# Patient Record
Sex: Female | Born: 1940 | Race: White | Hispanic: No | Smoking: Never smoker
Health system: Southern US, Community
[De-identification: ages and names within clinical notes are randomized; demographics above are authoritative.]

---

## 2003-12-05 ENCOUNTER — Ambulatory Visit (HOSPITAL_COMMUNITY): Admission: RE | Admit: 2003-12-05 | Discharge: 2003-12-05 | Payer: Self-pay | Admitting: *Deleted

## 2003-12-05 ENCOUNTER — Encounter (INDEPENDENT_AMBULATORY_CARE_PROVIDER_SITE_OTHER): Payer: Self-pay | Admitting: Specialist

## 2004-08-06 ENCOUNTER — Observation Stay (HOSPITAL_COMMUNITY): Admission: AD | Admit: 2004-08-06 | Discharge: 2004-08-07 | Payer: Self-pay | Admitting: Orthopedic Surgery

## 2004-08-06 ENCOUNTER — Encounter (INDEPENDENT_AMBULATORY_CARE_PROVIDER_SITE_OTHER): Payer: Self-pay | Admitting: *Deleted

## 2004-08-06 ENCOUNTER — Ambulatory Visit (HOSPITAL_BASED_OUTPATIENT_CLINIC_OR_DEPARTMENT_OTHER): Admission: RE | Admit: 2004-08-06 | Discharge: 2004-08-06 | Payer: Self-pay | Admitting: Orthopedic Surgery

## 2004-08-06 ENCOUNTER — Ambulatory Visit (HOSPITAL_COMMUNITY): Admission: RE | Admit: 2004-08-06 | Discharge: 2004-08-06 | Payer: Self-pay | Admitting: Orthopedic Surgery

## 2008-05-06 ENCOUNTER — Encounter: Admission: RE | Admit: 2008-05-06 | Discharge: 2008-05-06 | Payer: Self-pay | Admitting: Internal Medicine

## 2009-05-27 ENCOUNTER — Encounter: Admission: RE | Admit: 2009-05-27 | Discharge: 2009-05-27 | Payer: Self-pay | Admitting: Internal Medicine

## 2010-06-03 ENCOUNTER — Other Ambulatory Visit: Payer: Self-pay | Admitting: Internal Medicine

## 2010-06-03 DIAGNOSIS — Z1231 Encounter for screening mammogram for malignant neoplasm of breast: Secondary | ICD-10-CM

## 2010-06-17 ENCOUNTER — Ambulatory Visit
Admission: RE | Admit: 2010-06-17 | Discharge: 2010-06-17 | Disposition: A | Discharge status: Home / Self Care | Payer: Medicare Other | Source: Ambulatory Visit | Attending: Internal Medicine | Admitting: Internal Medicine

## 2010-06-17 ENCOUNTER — Ambulatory Visit: Payer: Self-pay

## 2010-06-17 DIAGNOSIS — Z1231 Encounter for screening mammogram for malignant neoplasm of breast: Secondary | ICD-10-CM

## 2010-07-10 NOTE — Op Note (Signed)
[name], [name]               ACCOUNT NO.:  [id number]   MEDICAL RECORD NO.:  [id number]          PATIENT TYPE:  AMB   LOCATION:  [facility]                         FACILITY:  [facility]   PHYSICIAN:  Marlowe Kays, M.D.  DATE OF BIRTH:  1940-09-11   DATE OF PROCEDURE:  08/06/2004  DATE OF DISCHARGE:                                 OPERATIVE REPORT   PREOPERATIVE DIAGNOSES:  1.  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  2.  Right third claw toe.   POSTOPERATIVE DIAGNOSES:  1.  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  2.  Right third claw toe.   OPERATION:  1.  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  2.  Correction right third claw toe with fusion of PIP joint and dorsal      capsulotomy MP joint.   SURGEON:  Marlowe Kays, M.D.   ASSISTANT:  Nurse.   ANESTHESIA:  General.   PATHOLOGY AND JUSTIFICATION FOR PROCEDURE:  She has had her problem for some  time and has failed conservative treatment.   DESCRIPTION OF PROCEDURE:  Satisfactory general anesthesia, bilateral  pneumatic tourniquet, legs were esmarched our nonsterilely and feet and  ankles prepped with DuraPrep, draped in a sterile field. In the left foot, I  made a dorsal web splitting incision between the second and third toes.  Mainly with blunt dissection, I dissected out a large congealed mass of  common digital nerve which I grasped with an Allis clamp and dissected it  out both with scissors and with cutting cautery removing a large mass of  tissue down in between the second and third metatarsal heads. This was sent  to pathology. I then checked to be sure that all nerve __________ material  had been removed and packed the wound with gauze and released the  tourniquet. I then went to the right foot where I performed the same  surgical procedure for the Morton's neuroma there and after this had been  excised, I made a dorsal incision bias to the outer side  of the third toe to  try and give as much skin between the two incisions as possible. I split the  extensor tendon on the lateral side and exposed the PIP joint. I then  removed the end portions of the proximal phalanx distally and middle phalanx  proximally so that we could get a good squared off cut. I then used a 0.45  smooth K wire retrograde to stabilize the toe. I stabilized the PIP joint. I  also undermined the Morton's neuroma excision to look at the MP joint of the  third toe and under direct visualization with some small scissors performed  a dorsal capsulotomy protecting the extensor tendon to help bring the  proximal phalanx down in line with the third metatarsal head. Under direct  visualization, I was then able to see the K wire go into the metatarsal head  with the proximal phalanx and the metatarsal head lining up in the same  plane. I then released the tourniquet on the right foot and went  back and  coagulated some minimal bleeders in the left foot closing the deep tissues  with interrupted 3-0 Vicryl and the skin with interrupted 4-0 nylon mattress  sutures. This wound was infiltrated with 0.5% plain Marcaine. I then  returned to the right foot and checked for minimal bleeders and closed the  Morton's neuroma incision in the same fashion. The third toe incision I  closed with running 4-0 Vicryl in the extensor tendon and 4-0 nylon in the  skin. These toes were also blocked with 0.5% plain Marcaine. The pin was  bent and cut with a pin protector placed. Betadine adaptic dry sterile  dressing to both feet. She tolerated the procedure well and at the time of  this dictation was on her way to the recovery room in satisfactory condition  with no known complications.       JA/MEDQ  D:  08/06/2004  T:  08/06/2004  Job:  [id number]

## 2010-07-23 ENCOUNTER — Encounter: Payer: Self-pay | Admitting: Podiatry

## 2011-03-29 DIAGNOSIS — D3130 Benign neoplasm of unspecified choroid: Secondary | ICD-10-CM | POA: Diagnosis not present

## 2011-05-11 ENCOUNTER — Other Ambulatory Visit: Payer: Self-pay | Admitting: Internal Medicine

## 2011-05-11 DIAGNOSIS — Z1231 Encounter for screening mammogram for malignant neoplasm of breast: Secondary | ICD-10-CM

## 2011-06-14 DIAGNOSIS — E559 Vitamin D deficiency, unspecified: Secondary | ICD-10-CM | POA: Diagnosis not present

## 2011-06-14 DIAGNOSIS — M949 Disorder of cartilage, unspecified: Secondary | ICD-10-CM | POA: Diagnosis not present

## 2011-06-14 DIAGNOSIS — M899 Disorder of bone, unspecified: Secondary | ICD-10-CM | POA: Diagnosis not present

## 2011-06-14 DIAGNOSIS — Z0000 Encounter for general adult medical examination without abnormal findings: Secondary | ICD-10-CM | POA: Diagnosis not present

## 2011-06-14 DIAGNOSIS — L84 Corns and callosities: Secondary | ICD-10-CM | POA: Diagnosis not present

## 2011-06-14 DIAGNOSIS — E785 Hyperlipidemia, unspecified: Secondary | ICD-10-CM | POA: Diagnosis not present

## 2011-06-18 ENCOUNTER — Ambulatory Visit
Admission: RE | Admit: 2011-06-18 | Discharge: 2011-06-18 | Disposition: A | Discharge status: Home / Self Care | Payer: PRIVATE HEALTH INSURANCE | Source: Ambulatory Visit | Attending: Internal Medicine | Admitting: Internal Medicine

## 2011-06-18 DIAGNOSIS — Z1231 Encounter for screening mammogram for malignant neoplasm of breast: Secondary | ICD-10-CM

## 2011-06-21 DIAGNOSIS — F341 Dysthymic disorder: Secondary | ICD-10-CM | POA: Diagnosis not present

## 2011-06-21 DIAGNOSIS — M899 Disorder of bone, unspecified: Secondary | ICD-10-CM | POA: Diagnosis not present

## 2011-06-21 DIAGNOSIS — E785 Hyperlipidemia, unspecified: Secondary | ICD-10-CM | POA: Diagnosis not present

## 2011-06-21 DIAGNOSIS — M949 Disorder of cartilage, unspecified: Secondary | ICD-10-CM | POA: Diagnosis not present

## 2011-06-21 DIAGNOSIS — Z0000 Encounter for general adult medical examination without abnormal findings: Secondary | ICD-10-CM | POA: Diagnosis not present

## 2011-06-24 DIAGNOSIS — F39 Unspecified mood [affective] disorder: Secondary | ICD-10-CM | POA: Diagnosis not present

## 2011-06-29 DIAGNOSIS — Z1212 Encounter for screening for malignant neoplasm of rectum: Secondary | ICD-10-CM | POA: Diagnosis not present

## 2011-07-05 DIAGNOSIS — F39 Unspecified mood [affective] disorder: Secondary | ICD-10-CM | POA: Diagnosis not present

## 2011-07-20 DIAGNOSIS — F39 Unspecified mood [affective] disorder: Secondary | ICD-10-CM | POA: Diagnosis not present

## 2011-08-17 DIAGNOSIS — F39 Unspecified mood [affective] disorder: Secondary | ICD-10-CM | POA: Diagnosis not present

## 2011-09-28 DIAGNOSIS — F39 Unspecified mood [affective] disorder: Secondary | ICD-10-CM | POA: Diagnosis not present

## 2011-10-01 DIAGNOSIS — R110 Nausea: Secondary | ICD-10-CM | POA: Diagnosis not present

## 2011-10-01 DIAGNOSIS — R42 Dizziness and giddiness: Secondary | ICD-10-CM | POA: Diagnosis not present

## 2011-10-01 DIAGNOSIS — I498 Other specified cardiac arrhythmias: Secondary | ICD-10-CM | POA: Diagnosis not present

## 2011-10-01 DIAGNOSIS — R51 Headache: Secondary | ICD-10-CM | POA: Diagnosis not present

## 2011-10-22 DIAGNOSIS — J309 Allergic rhinitis, unspecified: Secondary | ICD-10-CM | POA: Diagnosis not present

## 2011-10-22 DIAGNOSIS — E559 Vitamin D deficiency, unspecified: Secondary | ICD-10-CM | POA: Diagnosis not present

## 2011-10-22 DIAGNOSIS — R5383 Other fatigue: Secondary | ICD-10-CM | POA: Diagnosis not present

## 2011-10-22 DIAGNOSIS — R42 Dizziness and giddiness: Secondary | ICD-10-CM | POA: Diagnosis not present

## 2011-10-22 DIAGNOSIS — R5381 Other malaise: Secondary | ICD-10-CM | POA: Diagnosis not present

## 2011-11-01 DIAGNOSIS — L84 Corns and callosities: Secondary | ICD-10-CM | POA: Diagnosis not present

## 2011-11-04 DIAGNOSIS — R5381 Other malaise: Secondary | ICD-10-CM | POA: Diagnosis not present

## 2011-11-04 DIAGNOSIS — J309 Allergic rhinitis, unspecified: Secondary | ICD-10-CM | POA: Diagnosis not present

## 2011-11-26 DIAGNOSIS — R5383 Other fatigue: Secondary | ICD-10-CM | POA: Diagnosis not present

## 2011-11-26 DIAGNOSIS — R5381 Other malaise: Secondary | ICD-10-CM | POA: Diagnosis not present

## 2011-12-02 ENCOUNTER — Ambulatory Visit: Payer: Medicare Other | Admitting: Licensed Clinical Social Worker

## 2011-12-17 DIAGNOSIS — R404 Transient alteration of awareness: Secondary | ICD-10-CM | POA: Diagnosis not present

## 2011-12-17 DIAGNOSIS — R5383 Other fatigue: Secondary | ICD-10-CM | POA: Diagnosis not present

## 2011-12-17 DIAGNOSIS — F341 Dysthymic disorder: Secondary | ICD-10-CM | POA: Diagnosis not present

## 2011-12-17 DIAGNOSIS — R5381 Other malaise: Secondary | ICD-10-CM | POA: Diagnosis not present

## 2012-04-10 DIAGNOSIS — D3130 Benign neoplasm of unspecified choroid: Secondary | ICD-10-CM | POA: Diagnosis not present

## 2012-05-18 ENCOUNTER — Other Ambulatory Visit: Payer: Self-pay

## 2012-05-18 DIAGNOSIS — Z1231 Encounter for screening mammogram for malignant neoplasm of breast: Secondary | ICD-10-CM

## 2012-06-30 ENCOUNTER — Ambulatory Visit
Admission: RE | Admit: 2012-06-30 | Discharge: 2012-06-30 | Disposition: A | Discharge status: Home / Self Care | Payer: Medicare Other | Source: Ambulatory Visit

## 2012-06-30 DIAGNOSIS — Z1231 Encounter for screening mammogram for malignant neoplasm of breast: Secondary | ICD-10-CM | POA: Diagnosis not present

## 2012-06-30 DIAGNOSIS — E559 Vitamin D deficiency, unspecified: Secondary | ICD-10-CM | POA: Diagnosis not present

## 2012-06-30 DIAGNOSIS — E785 Hyperlipidemia, unspecified: Secondary | ICD-10-CM | POA: Diagnosis not present

## 2012-07-04 DIAGNOSIS — Z1212 Encounter for screening for malignant neoplasm of rectum: Secondary | ICD-10-CM | POA: Diagnosis not present

## 2012-07-07 DIAGNOSIS — M949 Disorder of cartilage, unspecified: Secondary | ICD-10-CM | POA: Diagnosis not present

## 2012-07-07 DIAGNOSIS — M899 Disorder of bone, unspecified: Secondary | ICD-10-CM | POA: Diagnosis not present

## 2012-07-07 DIAGNOSIS — E559 Vitamin D deficiency, unspecified: Secondary | ICD-10-CM | POA: Diagnosis not present

## 2012-07-07 DIAGNOSIS — Z0000 Encounter for general adult medical examination without abnormal findings: Secondary | ICD-10-CM | POA: Diagnosis not present

## 2012-07-07 DIAGNOSIS — R5381 Other malaise: Secondary | ICD-10-CM | POA: Diagnosis not present

## 2012-07-07 DIAGNOSIS — IMO0002 Reserved for concepts with insufficient information to code with codable children: Secondary | ICD-10-CM | POA: Diagnosis not present

## 2012-07-07 DIAGNOSIS — R5383 Other fatigue: Secondary | ICD-10-CM | POA: Diagnosis not present

## 2012-07-07 DIAGNOSIS — Z86010 Personal history of colonic polyps: Secondary | ICD-10-CM | POA: Diagnosis not present

## 2012-07-07 DIAGNOSIS — J309 Allergic rhinitis, unspecified: Secondary | ICD-10-CM | POA: Diagnosis not present

## 2012-07-07 DIAGNOSIS — E785 Hyperlipidemia, unspecified: Secondary | ICD-10-CM | POA: Diagnosis not present

## 2012-07-20 DIAGNOSIS — Q828 Other specified congenital malformations of skin: Secondary | ICD-10-CM | POA: Diagnosis not present

## 2012-07-20 DIAGNOSIS — L84 Corns and callosities: Secondary | ICD-10-CM | POA: Diagnosis not present

## 2012-11-20 DIAGNOSIS — L84 Corns and callosities: Secondary | ICD-10-CM | POA: Diagnosis not present

## 2013-02-12 ENCOUNTER — Encounter: Payer: Self-pay | Admitting: Podiatry

## 2013-02-12 ENCOUNTER — Ambulatory Visit: Payer: Medicare Other | Admitting: Podiatry

## 2013-02-12 VITALS — BP 121/61 | HR 67 | Resp 18

## 2013-02-12 DIAGNOSIS — L84 Corns and callosities: Secondary | ICD-10-CM

## 2013-02-12 NOTE — Progress Notes (Signed)
Subjective:     Patient ID: [name], female   DOB: 24-Feb-1940, 72 y.o.   MRN: [id number]  HPI patient presents with thick lesion sub-metatarsal both feet and orthotics which provide some benefit for her   Review of Systems     Objective:   Physical Exam Neurovascular status intact with significant structural forefoot derangement with lesion formation    Assessment:     Poor foot mechanics with lesions that are painful on both feet    Plan:     Debridement of painful lesions both feet with no bleeding noted

## 2013-02-12 NOTE — Progress Notes (Signed)
° °  Subjective:    Patient ID: [name], female    DOB: 1940-07-15, 72 y.o.   MRN: [id number]  HPI trim my calluses on the ball of both my feet    Review of Systems  Constitutional: Negative.   HENT: Negative.   Eyes: Negative.   Respiratory: Negative.   Cardiovascular: Negative.   Gastrointestinal: Negative.   Endocrine: Negative.   Genitourinary: Negative.   Musculoskeletal: Negative.   Skin: Negative.   Allergic/Immunologic: Positive for environmental allergies.  Neurological: Negative.   Hematological: Bruises/bleeds easily.  Psychiatric/Behavioral: Negative.        Objective:   Physical Exam        Assessment & Plan:

## 2013-04-20 DIAGNOSIS — D3130 Benign neoplasm of unspecified choroid: Secondary | ICD-10-CM | POA: Diagnosis not present

## 2013-04-26 ENCOUNTER — Ambulatory Visit: Payer: PRIVATE HEALTH INSURANCE | Admitting: Podiatry

## 2013-05-07 ENCOUNTER — Ambulatory Visit: Payer: Medicare Other | Admitting: Podiatry

## 2013-05-07 ENCOUNTER — Encounter: Payer: Self-pay | Admitting: Podiatry

## 2013-05-07 VITALS — BP 126/63 | HR 67 | Resp 15 | Ht 66.5 in | Wt 160.0 lb

## 2013-05-07 DIAGNOSIS — L84 Corns and callosities: Secondary | ICD-10-CM | POA: Diagnosis not present

## 2013-05-07 NOTE — Progress Notes (Signed)
   Subjective:    Patient ID: [name], female    DOB: 07-01-1940, 73 y.o.   MRN: [id number] Pt presents for debridement of B/L 2-3MPJ  Plantar callouses and padding of orthotic. HPI    Review of Systems     Objective:   Physical Exam        Assessment & Plan:

## 2013-05-09 NOTE — Progress Notes (Signed)
Subjective:     Patient ID: [name], female   DOB: 04-10-1940, 73 y.o.   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

## 2013-06-06 ENCOUNTER — Encounter: Payer: Self-pay | Admitting: Podiatry

## 2013-06-12 ENCOUNTER — Other Ambulatory Visit: Payer: Self-pay

## 2013-06-12 DIAGNOSIS — Z1231 Encounter for screening mammogram for malignant neoplasm of breast: Secondary | ICD-10-CM

## 2013-07-04 ENCOUNTER — Ambulatory Visit
Admission: RE | Admit: 2013-07-04 | Discharge: 2013-07-04 | Disposition: A | Discharge status: Home / Self Care | Payer: Medicare Other | Source: Ambulatory Visit

## 2013-07-04 ENCOUNTER — Encounter (INDEPENDENT_AMBULATORY_CARE_PROVIDER_SITE_OTHER): Payer: Self-pay

## 2013-07-04 DIAGNOSIS — Z1231 Encounter for screening mammogram for malignant neoplasm of breast: Secondary | ICD-10-CM | POA: Diagnosis not present

## 2013-07-19 DIAGNOSIS — L259 Unspecified contact dermatitis, unspecified cause: Secondary | ICD-10-CM | POA: Diagnosis not present

## 2013-07-24 DIAGNOSIS — M899 Disorder of bone, unspecified: Secondary | ICD-10-CM | POA: Diagnosis not present

## 2013-07-24 DIAGNOSIS — M949 Disorder of cartilage, unspecified: Secondary | ICD-10-CM | POA: Diagnosis not present

## 2013-08-13 ENCOUNTER — Ambulatory Visit: Payer: Medicare Other | Admitting: Podiatry

## 2013-08-23 ENCOUNTER — Ambulatory Visit: Payer: Medicare Other | Admitting: Podiatry

## 2013-08-23 ENCOUNTER — Encounter: Payer: Self-pay | Admitting: Podiatry

## 2013-08-23 DIAGNOSIS — L84 Corns and callosities: Secondary | ICD-10-CM | POA: Diagnosis not present

## 2013-08-25 NOTE — Progress Notes (Signed)
Subjective:     Patient ID: [name], female   DOB: [birth year], 73 y.o.   MRN: [id number]  HPI patient presents with lesions that are painful on both feet   Review of Systems     Objective:   Physical Exam Neurovascular unchanged with keratotic lesions plantar aspect bilateral    Assessment:     Callus formation bilateral    Plan:     Debris painful calluses bilateral with no bleeding noted

## 2013-12-03 ENCOUNTER — Ambulatory Visit: Payer: PRIVATE HEALTH INSURANCE | Admitting: Podiatry

## 2013-12-10 ENCOUNTER — Ambulatory Visit (INDEPENDENT_AMBULATORY_CARE_PROVIDER_SITE_OTHER): Payer: Medicare Other | Admitting: Podiatry

## 2013-12-10 DIAGNOSIS — L84 Corns and callosities: Secondary | ICD-10-CM | POA: Diagnosis not present

## 2013-12-10 DIAGNOSIS — M79673 Pain in unspecified foot: Secondary | ICD-10-CM

## 2013-12-10 NOTE — Progress Notes (Signed)
   Subjective:    Patient ID: [name], female    DOB: 1941/01/16, 73 y.o.   MRN: [id number]  HPI  Review of Systems     Objective:   Physical Exam        Assessment & Plan:

## 2013-12-11 NOTE — Progress Notes (Signed)
Subjective:     Patient ID: [name], female   DOB: 1941/01/30, 73 y.o.   MRN: [id number]  HPI patient presents with calluses on the bottom of both feet   Review of Systems     Objective:   Physical Exam Neurovascular status intact with thick calluses plantar aspect of the    Assessment:     Chronic lesion formation    Plan:     Debridement lesions on both feet no bleeding noted

## 2014-02-18 ENCOUNTER — Encounter: Payer: Self-pay | Admitting: Podiatry

## 2014-02-18 ENCOUNTER — Ambulatory Visit (INDEPENDENT_AMBULATORY_CARE_PROVIDER_SITE_OTHER): Payer: Medicare Other | Admitting: Podiatry

## 2014-02-18 DIAGNOSIS — M779 Enthesopathy, unspecified: Secondary | ICD-10-CM

## 2014-02-18 DIAGNOSIS — L84 Corns and callosities: Secondary | ICD-10-CM | POA: Diagnosis not present

## 2014-02-18 MED ORDER — TRIAMCINOLONE ACETONIDE 10 MG/ML IJ SUSP
10.0000 mg | Freq: Once | INTRAMUSCULAR | Status: AC
  Administered 2014-02-18: 10 mg

## 2014-02-18 NOTE — Progress Notes (Signed)
Subjective:     Patient ID: [name], female   DOB: 24-Sep-1940, 73 y.o.   MRN: [id number]  HPI patient presents stating I developed pain on the plantar aspect of my right foot and I have these calluses that I always have and I will probably need new orthotics   Review of Systems     Objective:   Physical Exam Neurovascular status unchanged with muscle strength adequate and range of motion subtalar midtarsal joint within normal limits. Patient is found to have discomfort with inflammation and fluid at the base of the fifth metatarsal right plantar surface with lesion formation and has keratotic lesion sub-third metatarsal of both feet    Assessment:     Inflammatory capsulitis base of fifth metatarsal right with probable porokeratotic type lesion and callus formation plantar of both feet    Plan:     Injected the plantar capsule right 3 mg Kenalog 5 mg Xylocaine and then did deep debridement of lesion and debridement of lesions on the third metatarsal of both feet. Instructed on new orthotics and scanned for customized orthotic devices to help reduce pressure

## 2014-03-18 ENCOUNTER — Ambulatory Visit: Payer: PRIVATE HEALTH INSURANCE | Admitting: Podiatry

## 2014-03-19 ENCOUNTER — Ambulatory Visit: Payer: Medicare Other | Admitting: *Deleted

## 2014-03-19 DIAGNOSIS — M779 Enthesopathy, unspecified: Secondary | ICD-10-CM

## 2014-03-19 NOTE — Progress Notes (Signed)
PICKING UP ORTHOTICS

## 2014-03-19 NOTE — Patient Instructions (Signed)

## 2014-03-21 ENCOUNTER — Ambulatory Visit: Payer: PRIVATE HEALTH INSURANCE | Admitting: Podiatry

## 2014-04-15 DIAGNOSIS — H40219 Acute angle-closure glaucoma, unspecified eye: Secondary | ICD-10-CM | POA: Diagnosis not present

## 2014-04-15 DIAGNOSIS — D3131 Benign neoplasm of right choroid: Secondary | ICD-10-CM | POA: Diagnosis not present

## 2014-05-03 ENCOUNTER — Other Ambulatory Visit: Payer: Self-pay | Admitting: Gastroenterology

## 2014-05-03 DIAGNOSIS — K640 First degree hemorrhoids: Secondary | ICD-10-CM | POA: Diagnosis not present

## 2014-05-03 DIAGNOSIS — K644 Residual hemorrhoidal skin tags: Secondary | ICD-10-CM | POA: Diagnosis not present

## 2014-05-03 DIAGNOSIS — Z86010 Personal history of colonic polyps: Secondary | ICD-10-CM | POA: Diagnosis not present

## 2014-05-03 DIAGNOSIS — D126 Benign neoplasm of colon, unspecified: Secondary | ICD-10-CM | POA: Diagnosis not present

## 2014-05-03 DIAGNOSIS — K5730 Diverticulosis of large intestine without perforation or abscess without bleeding: Secondary | ICD-10-CM | POA: Diagnosis not present

## 2014-05-03 DIAGNOSIS — D125 Benign neoplasm of sigmoid colon: Secondary | ICD-10-CM | POA: Diagnosis not present

## 2014-05-03 DIAGNOSIS — Z09 Encounter for follow-up examination after completed treatment for conditions other than malignant neoplasm: Secondary | ICD-10-CM | POA: Diagnosis not present

## 2014-05-13 ENCOUNTER — Encounter: Payer: Self-pay | Admitting: Podiatry

## 2014-05-13 ENCOUNTER — Ambulatory Visit (INDEPENDENT_AMBULATORY_CARE_PROVIDER_SITE_OTHER): Payer: Medicare Other | Admitting: Podiatry

## 2014-05-13 VITALS — BP 108/59 | HR 65 | Resp 15

## 2014-05-13 DIAGNOSIS — B079 Viral wart, unspecified: Secondary | ICD-10-CM | POA: Diagnosis not present

## 2014-05-13 DIAGNOSIS — B078 Other viral warts: Secondary | ICD-10-CM

## 2014-05-13 DIAGNOSIS — M779 Enthesopathy, unspecified: Secondary | ICD-10-CM | POA: Diagnosis not present

## 2014-05-13 DIAGNOSIS — B070 Plantar wart: Secondary | ICD-10-CM | POA: Diagnosis not present

## 2014-05-13 DIAGNOSIS — L84 Corns and callosities: Secondary | ICD-10-CM | POA: Diagnosis not present

## 2014-05-13 MED ORDER — TRIAMCINOLONE ACETONIDE 10 MG/ML IJ SUSP
10.0000 mg | Freq: Once | INTRAMUSCULAR | Status: AC
  Administered 2014-05-13: 10 mg

## 2014-05-13 NOTE — Progress Notes (Signed)
Subjective:     Patient ID: [name], female   DOB: 04-Feb-1941, 74 y.o.   MRN: [id number]  HPI patient presents with inflamed base of the right fifth metatarsal lesion on the lateral side of the foot and large keratotic lesions on the forefoot of both feet that are painful when pressed   Review of Systems     Objective:   Physical Exam Neurovascular status intact muscle strength adequate with inflammatory plantar capsulitis fifth met base right with keratotic lesion noted that upon debridement shows pinpoint bleeding on the lateral side of the right foot and painful keratotic lesions on the second metatarsal of both feet    Assessment:     Capsulitis sub-fifth metatarsal right with verruca plantaris plantar aspect right and callus formation bilateral    Plan:     Debride lesion and for the right I did go ahead today and I applied a strong medication to create an immune response to the right probable verruca lesion and applied sterile dressing I then injected the base of the fifth metatarsal 3 mg Kenalog 5 mg Xylocaine and debrided remaining nails. Reappoint to recheck

## 2014-05-30 ENCOUNTER — Other Ambulatory Visit: Payer: Self-pay

## 2014-05-30 DIAGNOSIS — Z1231 Encounter for screening mammogram for malignant neoplasm of breast: Secondary | ICD-10-CM

## 2014-06-10 ENCOUNTER — Ambulatory Visit (INDEPENDENT_AMBULATORY_CARE_PROVIDER_SITE_OTHER): Payer: Medicare Other | Admitting: Podiatry

## 2014-06-10 ENCOUNTER — Encounter: Payer: Self-pay | Admitting: Podiatry

## 2014-06-10 VITALS — BP 119/57 | HR 63 | Resp 11

## 2014-06-10 DIAGNOSIS — B079 Viral wart, unspecified: Secondary | ICD-10-CM | POA: Diagnosis not present

## 2014-06-10 DIAGNOSIS — B078 Other viral warts: Secondary | ICD-10-CM

## 2014-06-10 DIAGNOSIS — L84 Corns and callosities: Secondary | ICD-10-CM

## 2014-06-10 NOTE — Progress Notes (Signed)
Subjective:     Patient ID: [name], female   DOB: Jan 20, 1941, 74 y.o.   MRN: [id number]  HPI patient presents with lesions underneath the second metatarsals of both feet and keratotic lesion sub-fifth metatarsal base right that was diagnosed as wart   Review of Systems     Objective:   Physical Exam Neurovascular status intact with keratotic lesion subsecond metatarsals bilateral that are painful and lesion plantar right that upon debridement shows pinpoint bleeding and pain to lateral pressure    Assessment:     Verruca plantaris plantar right along with callus formation    Plan:     Discussed the verruca plantaris condition and treatment options and at this point we just debrided padded and we'll see how this does. May require more aggressive treatment and today I debrided the lesions on both feet

## 2014-07-12 ENCOUNTER — Ambulatory Visit: Payer: Medicare Other

## 2014-07-12 ENCOUNTER — Ambulatory Visit
Admission: RE | Admit: 2014-07-12 | Discharge: 2014-07-12 | Disposition: A | Discharge status: Home / Self Care | Payer: Medicare Other | Source: Ambulatory Visit

## 2014-07-12 DIAGNOSIS — Z1231 Encounter for screening mammogram for malignant neoplasm of breast: Secondary | ICD-10-CM | POA: Diagnosis not present

## 2014-08-19 ENCOUNTER — Ambulatory Visit (INDEPENDENT_AMBULATORY_CARE_PROVIDER_SITE_OTHER): Payer: Medicare Other | Admitting: Podiatry

## 2014-08-19 ENCOUNTER — Encounter: Payer: Self-pay | Admitting: Podiatry

## 2014-08-19 DIAGNOSIS — L84 Corns and callosities: Secondary | ICD-10-CM | POA: Diagnosis not present

## 2014-08-19 NOTE — Progress Notes (Signed)
Subjective:     Patient ID: [name], female   DOB: Jun 29, 1940, 74 y.o.   MRN: [id number]  HPI patient presents with lesions plantar aspect both second metatarsal that are painful and she cannot cut   Review of Systems     Objective:   Physical Exam Neurovascular status intact with thick keratotic lesion subsecond metatarsal bilateral    Assessment:     Lesion secondary to pressure    Plan:     Debris painful lesion second metatarsal both feet with no iatrogenic bleeding noted

## 2014-08-19 NOTE — Progress Notes (Signed)
   Subjective:    Patient ID: [name], female    DOB: 08-10-1940, 74 y.o.   MRN: [id number]  HPI "They're about the same."    Review of Systems     Objective:   Physical Exam        Assessment & Plan:

## 2014-08-29 DIAGNOSIS — Z0000 Encounter for general adult medical examination without abnormal findings: Secondary | ICD-10-CM | POA: Diagnosis not present

## 2014-08-29 DIAGNOSIS — E785 Hyperlipidemia, unspecified: Secondary | ICD-10-CM | POA: Diagnosis not present

## 2014-08-29 DIAGNOSIS — E559 Vitamin D deficiency, unspecified: Secondary | ICD-10-CM | POA: Diagnosis not present

## 2014-09-05 DIAGNOSIS — Z6825 Body mass index (BMI) 25.0-25.9, adult: Secondary | ICD-10-CM | POA: Diagnosis not present

## 2014-09-05 DIAGNOSIS — F341 Dysthymic disorder: Secondary | ICD-10-CM | POA: Diagnosis not present

## 2014-09-05 DIAGNOSIS — J309 Allergic rhinitis, unspecified: Secondary | ICD-10-CM | POA: Diagnosis not present

## 2014-09-05 DIAGNOSIS — E559 Vitamin D deficiency, unspecified: Secondary | ICD-10-CM | POA: Diagnosis not present

## 2014-09-05 DIAGNOSIS — Z1389 Encounter for screening for other disorder: Secondary | ICD-10-CM | POA: Diagnosis not present

## 2014-09-05 DIAGNOSIS — R05 Cough: Secondary | ICD-10-CM | POA: Diagnosis not present

## 2014-09-05 DIAGNOSIS — M8580 Other specified disorders of bone density and structure, unspecified site: Secondary | ICD-10-CM | POA: Diagnosis not present

## 2014-09-05 DIAGNOSIS — E785 Hyperlipidemia, unspecified: Secondary | ICD-10-CM | POA: Diagnosis not present

## 2014-09-05 DIAGNOSIS — Z23 Encounter for immunization: Secondary | ICD-10-CM | POA: Diagnosis not present

## 2014-09-05 DIAGNOSIS — Z0000 Encounter for general adult medical examination without abnormal findings: Secondary | ICD-10-CM | POA: Diagnosis not present

## 2014-09-05 DIAGNOSIS — Z86010 Personal history of colonic polyps: Secondary | ICD-10-CM | POA: Diagnosis not present

## 2014-11-11 ENCOUNTER — Encounter: Payer: Self-pay | Admitting: Podiatry

## 2014-11-11 ENCOUNTER — Ambulatory Visit (INDEPENDENT_AMBULATORY_CARE_PROVIDER_SITE_OTHER): Payer: Medicare Other | Admitting: Podiatry

## 2014-11-11 ENCOUNTER — Ambulatory Visit: Payer: Medicare Other

## 2014-11-11 DIAGNOSIS — Q667 Congenital pes cavus: Secondary | ICD-10-CM

## 2014-11-11 DIAGNOSIS — L84 Corns and callosities: Secondary | ICD-10-CM

## 2014-11-11 DIAGNOSIS — M2040 Other hammer toe(s) (acquired), unspecified foot: Secondary | ICD-10-CM | POA: Diagnosis not present

## 2014-11-11 DIAGNOSIS — M216X9 Other acquired deformities of unspecified foot: Secondary | ICD-10-CM

## 2014-11-13 NOTE — Progress Notes (Signed)
Subjective:     Patient ID: [name], female   DOB: 18-May-1940, 74 y.o.   MRN: [id number]  HPI patient presents with calluses under both feet stating they're getting worse and she may have to have something done   Review of Systems     Objective:   Physical Exam Neurovascular status intact with severe discomfort plantar third metatarsal bilateral with keratotic lesion formation and prominence of the metatarsal head upon palpation    Assessment:     Gradual worsening of symptoms with plantarflexed metatarsal and severe keratotic tissue formation    Plan:     Debride lesions on both third metatarsals and put markers with x-ray and reviewed the causes of this condition and ultimately that this will most likely require elevating osteotomy with digital stabilization procedure. Patient is interested and when she returns from vacation we will discuss procedure

## 2014-11-19 ENCOUNTER — Ambulatory Visit: Payer: Medicare Other | Admitting: Podiatry

## 2014-12-30 ENCOUNTER — Ambulatory Visit: Payer: Medicare Other | Admitting: Podiatry

## 2015-02-03 ENCOUNTER — Ambulatory Visit (INDEPENDENT_AMBULATORY_CARE_PROVIDER_SITE_OTHER): Payer: Medicare Other | Admitting: Podiatry

## 2015-02-03 ENCOUNTER — Encounter: Payer: Self-pay | Admitting: Podiatry

## 2015-02-03 VITALS — BP 115/71 | HR 79 | Resp 16

## 2015-02-03 DIAGNOSIS — L84 Corns and callosities: Secondary | ICD-10-CM | POA: Diagnosis not present

## 2015-02-03 DIAGNOSIS — M216X9 Other acquired deformities of unspecified foot: Secondary | ICD-10-CM

## 2015-02-03 DIAGNOSIS — M2040 Other hammer toe(s) (acquired), unspecified foot: Secondary | ICD-10-CM | POA: Diagnosis not present

## 2015-02-04 NOTE — Progress Notes (Signed)
Subjective:     Patient ID: [name], female   DOB: [birth year], 74 y.o.   MRN: [id number]  HPI patient states these lesions are getting worse and increasingly make it hard for me to walk. Patient points to the one on the right and states it is no longer manageable matter what she does   Review of Systems     Objective:   Physical Exam Neurovascular status found to be stable with severe lesion sub-third metatarsal right over left foot with digital deformities of the lesser digits right over left foot that are causing plantar flexion of the metatarsal.    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 I do believe digits 23 and 4 right we'll need to be fused along with elevating shortening osteotomy third right and I spent a great of time educating her on this. Today I debrided lesions she is tenably scheduled for surgery for mid January and will reappoint for consult

## 2015-02-18 ENCOUNTER — Telehealth: Payer: Self-pay | Admitting: *Deleted

## 2015-02-18 NOTE — Telephone Encounter (Signed)
"  I'm calling to verify that Dr. Paulla Dolly has me scheduled for surgery on January 17."  Yes, he has you scheduled for surgery that date.  "I know I need to register so I will go ahead and do that.  I guess they will be in direct contact with me about the surgery, correct?"  Yes, they will contact you a day or two prior to surgery and give you the arrival time.  "Thank you so much."

## 2015-03-03 ENCOUNTER — Ambulatory Visit (INDEPENDENT_AMBULATORY_CARE_PROVIDER_SITE_OTHER): Payer: Medicare Other | Admitting: Podiatry

## 2015-03-03 DIAGNOSIS — M216X9 Other acquired deformities of unspecified foot: Secondary | ICD-10-CM | POA: Diagnosis not present

## 2015-03-03 DIAGNOSIS — M2040 Other hammer toe(s) (acquired), unspecified foot: Secondary | ICD-10-CM

## 2015-03-03 DIAGNOSIS — L84 Corns and callosities: Secondary | ICD-10-CM | POA: Diagnosis not present

## 2015-03-03 NOTE — Patient Instructions (Signed)
Pre-Operative Instructions  Congratulations, you have decided to take an important step to improving your quality of life.  You can be assured that the doctors of [facility] will be with you every step of the way.  1. Plan to be at the surgery center/hospital at least 1 (one) hour prior to your scheduled time unless otherwise directed by the surgical center/hospital staff.  You must have a responsible adult accompany you, remain during the surgery and drive you home.  Make sure you have directions to the surgical center/hospital and know how to get there on time. 2. For hospital based surgery you will need to obtain a history and physical form from your family physician within 1 month prior to the date of surgery- we will give you a form for you primary physician.  3. We make every effort to accommodate the date you request for surgery.  There are however, times where surgery dates or times have to be moved.  We will contact you as soon as possible if a change in schedule is required.   4. No Aspirin/Ibuprofen for one week before surgery.  If you are on aspirin, any non-steroidal anti-inflammatory medications (Mobic, Aleve, Ibuprofen) you should stop taking it 7 days prior to your surgery.  You make take Tylenol  For pain prior to surgery.  5. Medications- If you are taking daily heart and blood pressure medications, seizure, reflux, allergy, asthma, anxiety, pain or diabetes medications, make sure the surgery center/hospital is aware before the day of surgery so they may notify you which medications to take or avoid the day of surgery. 6. No food or drink after midnight the night before surgery unless directed otherwise by surgical center/hospital staff. 7. No alcoholic beverages 24 hours prior to surgery.  No smoking 24 hours prior to or 24 hours after surgery. 8. Wear loose pants or shorts- loose enough to fit over bandages, boots, and casts. 9. No slip on shoes, sneakers are best. 10. Bring  your boot with you to the surgery center/hospital.  Also bring crutches or a walker if your physician has prescribed it for you.  If you do not have this equipment, it will be provided for you after surgery. 11. If you have not been contracted by the surgery center/hospital by the day before your surgery, call to confirm the date and time of your surgery. 12. Leave-time from work may vary depending on the type of surgery you have.  Appropriate arrangements should be made prior to surgery with your employer. 13. Prescriptions will be provided immediately following surgery by your doctor.  Have these filled as soon as possible after surgery and take the medication as directed. 14. Remove nail polish on the operative foot. 15. Wash the night before surgery.  The night before surgery wash the foot and leg well with the antibacterial soap provided and water paying special attention to beneath the toenails and in between the toes.  Rinse thoroughly with water and dry well with a towel.  Perform this wash unless told not to do so by your physician.  Enclosed: 1 Ice pack (please put in freezer the night before surgery)   1 Hibiclens skin cleaner   Pre-op Instructions  If you have any questions regarding the instructions, do not hesitate to call our office.  [place]: [address] [place], [place] [address] [phone]  [place]: [address] [place], [place] [address] [phone]  [place]: [address]  [place], [place] [address] [phone]  Dr. Richard   Tuchman DPM, Dr. Amerika Nourse DPM Dr. Richard Sikora DPM, Dr. M. Todd Hyatt DPM, Dr. Kathryn Egerton DPM 

## 2015-03-05 NOTE — Progress Notes (Signed)
Subjective:     Patient ID: [name], female   DOB: Jun 01, 1940, 75 y.o.   MRN: [id number]  HPI patient presents to review her forefoot pain right and to consider surgical correction of underlying deformity   Review of Systems     Objective:   Physical Exam Neurovascular status intact with good digital perfusion noted 1-5 both feet with severe keratotic lesion sub-third metatarsal right with contracted digital deformity second third and fourth toes of the right foot that are painful on top and contributory towards problems    Assessment:     Plantarflexed metatarsal with severe lesion formation and digital deformities right foot    Plan:     H&P and conditions reviewed with patient. Today I went ahead and I have recommended elevating osteotomy third right along with digital fusion digits 234 right foot. I allowed her to read consent form reviewing alternative treatments and complications and I spent a great deal of time going over this surgery and the difficulty of correction. Patient wants surgery understanding everything as outlined and understands there is no guarantee this will solve her problem and the callus may recur area she also understands all other complications and signs consent form today and understands total recovery can be from 6 months to one year. Dispensed boot for usage after surgery and she will utilize it prior to surgery in order to be comfortable with

## 2015-03-11 ENCOUNTER — Encounter: Payer: Self-pay | Admitting: Podiatry

## 2015-03-11 DIAGNOSIS — E7800 Pure hypercholesterolemia, unspecified: Secondary | ICD-10-CM | POA: Diagnosis not present

## 2015-03-11 DIAGNOSIS — M21549 Acquired clubfoot, unspecified foot: Secondary | ICD-10-CM | POA: Diagnosis not present

## 2015-03-11 DIAGNOSIS — M2041 Other hammer toe(s) (acquired), right foot: Secondary | ICD-10-CM | POA: Diagnosis not present

## 2015-03-11 DIAGNOSIS — M216X1 Other acquired deformities of right foot: Secondary | ICD-10-CM | POA: Diagnosis not present

## 2015-03-20 ENCOUNTER — Other Ambulatory Visit: Payer: Self-pay

## 2015-03-21 ENCOUNTER — Other Ambulatory Visit: Payer: Self-pay

## 2015-03-21 ENCOUNTER — Ambulatory Visit (INDEPENDENT_AMBULATORY_CARE_PROVIDER_SITE_OTHER): Payer: Medicare Other

## 2015-03-21 ENCOUNTER — Other Ambulatory Visit: Payer: Self-pay | Admitting: Podiatry

## 2015-03-21 ENCOUNTER — Ambulatory Visit (INDEPENDENT_AMBULATORY_CARE_PROVIDER_SITE_OTHER): Payer: Medicare Other | Admitting: Podiatry

## 2015-03-21 VITALS — Temp 97.7°F

## 2015-03-21 DIAGNOSIS — Z9889 Other specified postprocedural states: Secondary | ICD-10-CM

## 2015-03-21 DIAGNOSIS — M2040 Other hammer toe(s) (acquired), unspecified foot: Secondary | ICD-10-CM

## 2015-03-21 NOTE — Progress Notes (Signed)
Subjective:     Patient ID: [name], female   DOB: [birth year], 75 y.o.   MRN: [id number]  HPI this patient returns to the office 1 week after foot surgery. She says she is doing well with minimal pain. She had surgery which included metatarsal osteotomy third right foot as well as hammertoe correction 234 with K wire fixation. She presents the office wearing the bandage applied at the surgical center. She presents the office wearing a Cam Walker, which was dispensed following surgery. She says she had difficulty with the medication for pain and has been taking Aleve. She says she is doing well except for sharp, shooting pain through the bottom of her right foot   Review of Systems     Objective:   Physical Exam neurovascular status is intact. Good coaptation and sutures are intact at all incisions. No evidence of any redness from infection nor drainage from the surgical sites. No evidence of infection  at the surgical site.  K wires are intact in the second, third and fourth toes     Assessment:     S/p foot surgery     Plan:     ROV  X-rays were intact with good positioning capital fragment third metatarsal.  K-wires in good position. Surgical sites are healing with no infection.  Redressing surgical site.  Told patient to wear cam walker until seen by Dr. Paulla Dolly.  Dispense surgical shoe for driving.  Keep bandage clean and dry.    Gardiner Barefoot DPM

## 2015-03-28 ENCOUNTER — Ambulatory Visit (INDEPENDENT_AMBULATORY_CARE_PROVIDER_SITE_OTHER): Payer: Medicare Other | Admitting: Podiatry

## 2015-03-28 DIAGNOSIS — M2040 Other hammer toe(s) (acquired), unspecified foot: Secondary | ICD-10-CM | POA: Diagnosis not present

## 2015-03-28 DIAGNOSIS — Z9889 Other specified postprocedural states: Secondary | ICD-10-CM | POA: Diagnosis not present

## 2015-03-28 NOTE — Progress Notes (Signed)
Subjective:     Patient ID: [name], female   DOB: 1940-10-20, 75 y.o.   MRN: [id number]  HPI this patient presents the office 2 weeks after foot surgery on her right foot. He states that she is doing well following her foot surgery and that the wires are still intact. She presents the office with a bandage applied last week and wearing her cam walker. He states she is not having much pain and discomfort through her foot and presents today for removal of sutures at the surgical site   Review of Systems     Objective:   Physical Exam Neurovascular status is intact. Good skin coaption is noted Iris of the second, third and fourth toes of the right foot appear to be intact. No evidence of any redness or drainage or infection at the surgical sites    Assessment:     S/p foot surgery right foot    Plan:     POV2.  Sutures removed. Patient was instructed to continue to walk with her cam walker that she was given permission to drive using a surgical shoe. She was told that she could get her surgical site wet  at this point.She  is to return to the office in 2 weeks at which time the K wires will be removed .  Call the office as needed.   Gardiner Barefoot DPM

## 2015-04-14 ENCOUNTER — Encounter: Payer: Self-pay | Admitting: Podiatry

## 2015-04-14 ENCOUNTER — Ambulatory Visit (INDEPENDENT_AMBULATORY_CARE_PROVIDER_SITE_OTHER): Payer: Medicare Other | Admitting: Podiatry

## 2015-04-14 ENCOUNTER — Ambulatory Visit (INDEPENDENT_AMBULATORY_CARE_PROVIDER_SITE_OTHER): Payer: Medicare Other

## 2015-04-14 DIAGNOSIS — Z9889 Other specified postprocedural states: Secondary | ICD-10-CM

## 2015-04-14 DIAGNOSIS — M2040 Other hammer toe(s) (acquired), unspecified foot: Secondary | ICD-10-CM

## 2015-04-15 NOTE — Progress Notes (Signed)
Subjective:     Patient ID: [name], female   DOB: [birth year], 75 y.o.   MRN: [id number]  HPI patient presents stating I'm doing really well with my toes in good alignment but some elevation of my fifth digit   Review of Systems     Objective:   Physical Exam  neurovascular status intact negative Homans sign good alignment of the second third fourth toes right with dorsal medial rotation fifth toe right with pins in place second third and fourth toes    Assessment:      doing well post osteotomy post digital fusion right foot    Plan:      H&P conditions reviewed with patient pins removed sterile dressings applied and reviewed gradual return soft shoe gear. Above ankle brace was applied with digital splints to hold the toes and alignment and we discussed possible tenotomy fifth digit one point in future   X-ray report indicating good alignment of the digits with no pathology and screw in place third metatarsal

## 2015-04-28 ENCOUNTER — Telehealth: Payer: Self-pay | Admitting: *Deleted

## 2015-04-28 NOTE — Telephone Encounter (Signed)
Pt has foot surgery 03/11/2015, and is a dental office, does she need to be pre-medicated because she had a screw placement?  I asked Dr. Paulla Dolly and he stated pt did not need antibiotic prior to dental procedure.

## 2015-05-12 ENCOUNTER — Ambulatory Visit (INDEPENDENT_AMBULATORY_CARE_PROVIDER_SITE_OTHER): Payer: Medicare Other

## 2015-05-12 ENCOUNTER — Ambulatory Visit (INDEPENDENT_AMBULATORY_CARE_PROVIDER_SITE_OTHER): Payer: Medicare Other | Admitting: Podiatry

## 2015-05-12 DIAGNOSIS — M2040 Other hammer toe(s) (acquired), unspecified foot: Secondary | ICD-10-CM

## 2015-05-12 DIAGNOSIS — Z9889 Other specified postprocedural states: Secondary | ICD-10-CM

## 2015-05-12 DIAGNOSIS — L84 Corns and callosities: Secondary | ICD-10-CM | POA: Diagnosis not present

## 2015-05-13 NOTE — Progress Notes (Signed)
Subjective:     Patient ID: [name], female   DOB: 1941/01/03, 75 y.o.   MRN: [id number]  HPI patient states that she's doing pretty well with diminished discomfort but lesion formation still present but very pleased so far with surgery   Review of Systems     Objective:   Physical Exam Neurovascular status intact muscle strength adequate with significant diminishment of pain in the plantar aspect right foot with toes in good alignment and keratotic lesions still present but much thinner than previous. Patient does have elevation of the fifth digit right secondary to fusion of digits 234 right    Assessment:     Doing very well post forefoot reconstruction right with lesion formation. 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 Recommended tenotomy at next visit and reviewed lowering the fifth toe with tenotomy which will be done in the office  X-ray report indicated toes are in good alignment screws in place with no pathology upon x-ray evaluation.

## 2015-05-23 DIAGNOSIS — H2513 Age-related nuclear cataract, bilateral: Secondary | ICD-10-CM | POA: Diagnosis not present

## 2015-05-23 DIAGNOSIS — D3131 Benign neoplasm of right choroid: Secondary | ICD-10-CM | POA: Diagnosis not present

## 2015-06-05 ENCOUNTER — Ambulatory Visit: Payer: Medicare Other

## 2015-06-05 ENCOUNTER — Ambulatory Visit (INDEPENDENT_AMBULATORY_CARE_PROVIDER_SITE_OTHER): Payer: Medicare Other | Admitting: Podiatry

## 2015-06-05 ENCOUNTER — Encounter: Payer: Self-pay | Admitting: Podiatry

## 2015-06-05 DIAGNOSIS — Z9889 Other specified postprocedural states: Secondary | ICD-10-CM

## 2015-06-05 DIAGNOSIS — M2040 Other hammer toe(s) (acquired), unspecified foot: Secondary | ICD-10-CM

## 2015-06-08 NOTE — Progress Notes (Signed)
Subjective:     Patient ID: [name], female   DOB: 10-Jul-1940, 75 y.o.   MRN: [id number]  HPI patient presents with well-healed surgical sites hallux second third fourth digit right with elevation of the fifth digit right that bothers her and lesion that continues to resolve plantar right   Review of Systems     Objective:   Physical Exam Neurovascular status intact muscle strength adequate with continued elevation fifth digit right with excellent structural position of adjacent digits post surgery    Assessment:     Doing very well post surgery with elevated fifth digit right with rigid contracture    Plan:     H&P conditions reviewed and discussed. She wants the fifth digit reduced and I've recommended tenotomy and we will do this in the office today. I applied sterile dressings around the area and a sterile prep to the fifth digit and before doing this I did inject with 60 mg Xylocaine Marcaine mixture. After stability achieved utilizing sterile instrumentation I released the tendon into the fifth digit and the metatarsophalangeal joint of the fifth MPJ and found the toe to lower to an adequate range. I sutured with 5-0 nylon applied sterile dressing plantar flexing the fifth digit and I advised on elevation and compression immobilization. Patient will be seen back for stitch removal 2 weeks or earlier if any issues should occur

## 2015-06-10 ENCOUNTER — Other Ambulatory Visit: Payer: Self-pay

## 2015-06-10 DIAGNOSIS — Z1231 Encounter for screening mammogram for malignant neoplasm of breast: Secondary | ICD-10-CM

## 2015-06-20 ENCOUNTER — Ambulatory Visit (INDEPENDENT_AMBULATORY_CARE_PROVIDER_SITE_OTHER): Payer: Medicare Other | Admitting: Podiatry

## 2015-06-20 ENCOUNTER — Encounter: Payer: Self-pay | Admitting: Podiatry

## 2015-06-20 DIAGNOSIS — Z9889 Other specified postprocedural states: Secondary | ICD-10-CM

## 2015-06-20 DIAGNOSIS — M2040 Other hammer toe(s) (acquired), unspecified foot: Secondary | ICD-10-CM

## 2015-06-23 NOTE — Progress Notes (Signed)
Subjective:     Patient ID: [name], female   DOB: Dec 19, 1940, 75 y.o.   MRN: [id number]  HPI patient states I'm doing real well with the callus going away and my toe is coming down on my fifth digit   Review of Systems     Objective:   Physical Exam Neurovascular status intact muscle strength adequate with good alignment fifth digit with stitches intact wound edges well coapted and all other incisions healing well with resolution of plantar callus tissue    Assessment:     Doing well post forefoot reconstruction right and tenotomy fifth digit right    Plan:     Stitches removed sterile dressing applied and instructed on gradual increase in activity levels. Reappoint as needed and is discharged from this procedure at this time

## 2015-07-16 ENCOUNTER — Ambulatory Visit: Payer: Medicare Other

## 2015-07-17 ENCOUNTER — Ambulatory Visit (INDEPENDENT_AMBULATORY_CARE_PROVIDER_SITE_OTHER): Payer: Medicare Other

## 2015-07-17 ENCOUNTER — Ambulatory Visit (INDEPENDENT_AMBULATORY_CARE_PROVIDER_SITE_OTHER): Payer: Medicare Other | Admitting: Podiatry

## 2015-07-17 DIAGNOSIS — M2040 Other hammer toe(s) (acquired), unspecified foot: Secondary | ICD-10-CM

## 2015-07-17 DIAGNOSIS — Z9889 Other specified postprocedural states: Secondary | ICD-10-CM | POA: Diagnosis not present

## 2015-07-17 DIAGNOSIS — L84 Corns and callosities: Secondary | ICD-10-CM

## 2015-07-17 NOTE — Progress Notes (Signed)
Subjective:     Patient ID: [name], female   DOB: [birth year], 75 y.o.   MRN: [id number]  HPI patient states I'm doing much better with my right but I'm having pain with my left lesion and I may need surgery on that   Review of Systems     Objective:   Physical Exam Neurovascular status intact with patient's right foot doing very well with a keratotic lesion having resolved with mild between the second and third digit which is been present with plantarflexed third metatarsal left with hammertoe deformity and keratotic lesion formation on the left    Assessment:     Doing well postoperatively right with structural damage to the left with keratotic lesion    Plan:     H&P and x-rays of right reviewed with patient. At this point we are discharging from that foot and for the left I discussed hammertoe tenotomy with possibility for fusion and elevating osteotomy some day depending on how the lesion response. Deep debridement accomplished today and reappoint as needed  Tray right indicates good healing of the osteotomy was separation of the second and third toes which has been present the entire time

## 2015-07-18 ENCOUNTER — Other Ambulatory Visit: Payer: Medicare Other

## 2015-08-06 ENCOUNTER — Ambulatory Visit: Payer: Medicare Other

## 2015-08-13 ENCOUNTER — Ambulatory Visit
Admission: RE | Admit: 2015-08-13 | Discharge: 2015-08-13 | Disposition: A | Discharge status: Home / Self Care | Payer: Medicare Other | Source: Ambulatory Visit

## 2015-08-13 DIAGNOSIS — Z1231 Encounter for screening mammogram for malignant neoplasm of breast: Secondary | ICD-10-CM

## 2015-09-01 DIAGNOSIS — E784 Other hyperlipidemia: Secondary | ICD-10-CM | POA: Diagnosis not present

## 2015-09-01 DIAGNOSIS — E559 Vitamin D deficiency, unspecified: Secondary | ICD-10-CM | POA: Diagnosis not present

## 2015-09-01 DIAGNOSIS — M859 Disorder of bone density and structure, unspecified: Secondary | ICD-10-CM | POA: Diagnosis not present

## 2015-09-11 DIAGNOSIS — R400 Somnolence: Secondary | ICD-10-CM | POA: Diagnosis not present

## 2015-09-11 DIAGNOSIS — M8580 Other specified disorders of bone density and structure, unspecified site: Secondary | ICD-10-CM | POA: Diagnosis not present

## 2015-09-11 DIAGNOSIS — L723 Sebaceous cyst: Secondary | ICD-10-CM | POA: Diagnosis not present

## 2015-09-11 DIAGNOSIS — Z86010 Personal history of colonic polyps: Secondary | ICD-10-CM | POA: Diagnosis not present

## 2015-09-11 DIAGNOSIS — R5383 Other fatigue: Secondary | ICD-10-CM | POA: Diagnosis not present

## 2015-09-11 DIAGNOSIS — R438 Other disturbances of smell and taste: Secondary | ICD-10-CM | POA: Diagnosis not present

## 2015-09-11 DIAGNOSIS — Z1389 Encounter for screening for other disorder: Secondary | ICD-10-CM | POA: Diagnosis not present

## 2015-09-11 DIAGNOSIS — E559 Vitamin D deficiency, unspecified: Secondary | ICD-10-CM | POA: Diagnosis not present

## 2015-09-11 DIAGNOSIS — Z0000 Encounter for general adult medical examination without abnormal findings: Secondary | ICD-10-CM | POA: Diagnosis not present

## 2015-09-11 DIAGNOSIS — J309 Allergic rhinitis, unspecified: Secondary | ICD-10-CM | POA: Diagnosis not present

## 2015-09-11 DIAGNOSIS — E784 Other hyperlipidemia: Secondary | ICD-10-CM | POA: Diagnosis not present

## 2015-09-11 DIAGNOSIS — Z6827 Body mass index (BMI) 27.0-27.9, adult: Secondary | ICD-10-CM | POA: Diagnosis not present

## 2015-09-12 DIAGNOSIS — Z1212 Encounter for screening for malignant neoplasm of rectum: Secondary | ICD-10-CM | POA: Diagnosis not present

## 2015-10-17 ENCOUNTER — Encounter: Payer: Self-pay | Admitting: Podiatry

## 2015-10-17 ENCOUNTER — Ambulatory Visit (INDEPENDENT_AMBULATORY_CARE_PROVIDER_SITE_OTHER): Payer: Medicare Other | Admitting: Podiatry

## 2015-10-17 ENCOUNTER — Ambulatory Visit (INDEPENDENT_AMBULATORY_CARE_PROVIDER_SITE_OTHER): Payer: Medicare Other

## 2015-10-17 DIAGNOSIS — M2040 Other hammer toe(s) (acquired), unspecified foot: Secondary | ICD-10-CM

## 2015-10-17 DIAGNOSIS — M216X9 Other acquired deformities of unspecified foot: Secondary | ICD-10-CM | POA: Diagnosis not present

## 2015-10-17 DIAGNOSIS — L84 Corns and callosities: Secondary | ICD-10-CM | POA: Diagnosis not present

## 2015-10-17 DIAGNOSIS — Z9889 Other specified postprocedural states: Secondary | ICD-10-CM

## 2015-10-17 NOTE — Progress Notes (Signed)
Subjective:     Patient ID: [name], female   DOB: 04-21-1940, 75 y.o.   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

## 2015-10-31 NOTE — Progress Notes (Signed)
DOS 01.17.2017 1. Metatarsal osteotomy 3rd right foot 2. Hammertoe repair 2,3,4 with pin fixation right foot

## 2015-11-05 DIAGNOSIS — L720 Epidermal cyst: Secondary | ICD-10-CM | POA: Diagnosis not present

## 2015-11-05 DIAGNOSIS — L821 Other seborrheic keratosis: Secondary | ICD-10-CM | POA: Diagnosis not present

## 2015-11-05 DIAGNOSIS — L820 Inflamed seborrheic keratosis: Secondary | ICD-10-CM | POA: Diagnosis not present

## 2015-11-05 DIAGNOSIS — D485 Neoplasm of uncertain behavior of skin: Secondary | ICD-10-CM | POA: Diagnosis not present

## 2015-12-02 DIAGNOSIS — L720 Epidermal cyst: Secondary | ICD-10-CM | POA: Diagnosis not present

## 2016-01-12 DIAGNOSIS — E784 Other hyperlipidemia: Secondary | ICD-10-CM | POA: Diagnosis not present

## 2016-04-23 DIAGNOSIS — D3131 Benign neoplasm of right choroid: Secondary | ICD-10-CM | POA: Diagnosis not present

## 2016-06-03 ENCOUNTER — Ambulatory Visit (INDEPENDENT_AMBULATORY_CARE_PROVIDER_SITE_OTHER): Payer: Medicare Other | Admitting: Podiatry

## 2016-06-03 DIAGNOSIS — L84 Corns and callosities: Secondary | ICD-10-CM

## 2016-06-06 NOTE — Progress Notes (Signed)
Subjective:     Patient ID: [name], female   DOB: 04-Aug-1940, 76 y.o.   MRN: [id number]  HPI patient continues to complain of callus formation bilateral but the left is worse than the right which was not operated on   Review of Systems     Objective:   Physical Exam Neurovascular status intact with well-healed surgical sites right with plantar keratotic lesion left over right    Assessment:     Lesion secondary to structural change    Plan:     Debridement lesion bilateral and reappoint as needed

## 2016-06-09 DIAGNOSIS — H2513 Age-related nuclear cataract, bilateral: Secondary | ICD-10-CM | POA: Diagnosis not present

## 2016-06-09 DIAGNOSIS — H25013 Cortical age-related cataract, bilateral: Secondary | ICD-10-CM | POA: Diagnosis not present

## 2016-06-09 DIAGNOSIS — H2512 Age-related nuclear cataract, left eye: Secondary | ICD-10-CM | POA: Diagnosis not present

## 2016-06-09 DIAGNOSIS — H16223 Keratoconjunctivitis sicca, not specified as Sjogren's, bilateral: Secondary | ICD-10-CM | POA: Diagnosis not present

## 2016-07-08 ENCOUNTER — Other Ambulatory Visit: Payer: Self-pay | Admitting: Internal Medicine

## 2016-07-08 DIAGNOSIS — Z1231 Encounter for screening mammogram for malignant neoplasm of breast: Secondary | ICD-10-CM

## 2016-08-03 DIAGNOSIS — H2513 Age-related nuclear cataract, bilateral: Secondary | ICD-10-CM | POA: Diagnosis not present

## 2016-08-03 DIAGNOSIS — H2512 Age-related nuclear cataract, left eye: Secondary | ICD-10-CM | POA: Diagnosis not present

## 2016-08-09 DIAGNOSIS — H2513 Age-related nuclear cataract, bilateral: Secondary | ICD-10-CM | POA: Diagnosis not present

## 2016-08-09 DIAGNOSIS — H2511 Age-related nuclear cataract, right eye: Secondary | ICD-10-CM | POA: Diagnosis not present

## 2016-08-13 ENCOUNTER — Ambulatory Visit: Payer: Medicare Other

## 2016-08-17 ENCOUNTER — Ambulatory Visit
Admission: RE | Admit: 2016-08-17 | Discharge: 2016-08-17 | Disposition: A | Discharge status: Home / Self Care | Payer: Medicare Other | Source: Ambulatory Visit | Attending: Internal Medicine | Admitting: Internal Medicine

## 2016-08-17 DIAGNOSIS — Z1231 Encounter for screening mammogram for malignant neoplasm of breast: Secondary | ICD-10-CM

## 2017-01-07 ENCOUNTER — Ambulatory Visit: Payer: Medicare Other | Admitting: Podiatry

## 2017-01-12 ENCOUNTER — Encounter: Payer: Self-pay | Admitting: Podiatry

## 2017-01-12 ENCOUNTER — Ambulatory Visit (INDEPENDENT_AMBULATORY_CARE_PROVIDER_SITE_OTHER): Payer: Medicare Other | Admitting: Podiatry

## 2017-01-12 DIAGNOSIS — L84 Corns and callosities: Secondary | ICD-10-CM

## 2017-01-14 NOTE — Progress Notes (Signed)
Subjective:    Patient ID: [name], female   DOB: 76 y.o.   MRN: [id number]   HPI Patient states the calluses are bothering her quite a bit and that she's not been here for a while   ROS      Objective:  Physical Exam neurovascular status intact with deep keratotic lesion plantar aspect third metatarsal bilateral     Assessment:  Chronic lesion formation with pain       Plan:    Debrided lesions discussed possible changes and orthotics and reappoint in 4 months for routine care

## 2017-01-18 DIAGNOSIS — M859 Disorder of bone density and structure, unspecified: Secondary | ICD-10-CM | POA: Diagnosis not present

## 2017-01-18 DIAGNOSIS — E7849 Other hyperlipidemia: Secondary | ICD-10-CM | POA: Diagnosis not present

## 2017-01-18 DIAGNOSIS — R82998 Other abnormal findings in urine: Secondary | ICD-10-CM | POA: Diagnosis not present

## 2017-01-25 DIAGNOSIS — R05 Cough: Secondary | ICD-10-CM | POA: Diagnosis not present

## 2017-01-25 DIAGNOSIS — R400 Somnolence: Secondary | ICD-10-CM | POA: Diagnosis not present

## 2017-01-25 DIAGNOSIS — J309 Allergic rhinitis, unspecified: Secondary | ICD-10-CM | POA: Diagnosis not present

## 2017-01-25 DIAGNOSIS — E559 Vitamin D deficiency, unspecified: Secondary | ICD-10-CM | POA: Diagnosis not present

## 2017-01-25 DIAGNOSIS — Z0000 Encounter for general adult medical examination without abnormal findings: Secondary | ICD-10-CM | POA: Diagnosis not present

## 2017-01-25 DIAGNOSIS — Z6827 Body mass index (BMI) 27.0-27.9, adult: Secondary | ICD-10-CM | POA: Diagnosis not present

## 2017-01-25 DIAGNOSIS — M8580 Other specified disorders of bone density and structure, unspecified site: Secondary | ICD-10-CM | POA: Diagnosis not present

## 2017-01-25 DIAGNOSIS — E7849 Other hyperlipidemia: Secondary | ICD-10-CM | POA: Diagnosis not present

## 2017-01-25 DIAGNOSIS — Z86010 Personal history of colonic polyps: Secondary | ICD-10-CM | POA: Diagnosis not present

## 2017-01-25 DIAGNOSIS — Z1389 Encounter for screening for other disorder: Secondary | ICD-10-CM | POA: Diagnosis not present

## 2017-01-26 ENCOUNTER — Other Ambulatory Visit: Payer: Self-pay | Admitting: Internal Medicine

## 2017-01-26 DIAGNOSIS — E785 Hyperlipidemia, unspecified: Secondary | ICD-10-CM

## 2017-02-04 DIAGNOSIS — Z1212 Encounter for screening for malignant neoplasm of rectum: Secondary | ICD-10-CM | POA: Diagnosis not present

## 2017-04-20 ENCOUNTER — Ambulatory Visit: Payer: Medicare Other | Admitting: Podiatry

## 2017-05-04 ENCOUNTER — Ambulatory Visit (INDEPENDENT_AMBULATORY_CARE_PROVIDER_SITE_OTHER): Payer: Medicare Other | Admitting: Podiatry

## 2017-05-04 ENCOUNTER — Encounter: Payer: Self-pay | Admitting: Podiatry

## 2017-05-04 DIAGNOSIS — L84 Corns and callosities: Secondary | ICD-10-CM | POA: Diagnosis not present

## 2017-05-04 NOTE — Progress Notes (Signed)
Subjective:   Patient ID: [name], female   DOB: 77 y.o.   MRN: [id number]   HPI Patient presents with lesions underneath both feet   ROS      Objective:  Physical Exam  Significant lesion sub-left third metatarsal left with moderate lesion on the right is not as painful     Assessment:  Chronic lesion formation bilateral     Plan:  Debride painful lesions bilateral with no iatrogenic bleeding

## 2017-06-09 ENCOUNTER — Other Ambulatory Visit: Payer: Self-pay

## 2017-06-09 DIAGNOSIS — E785 Hyperlipidemia, unspecified: Secondary | ICD-10-CM

## 2017-06-09 NOTE — Progress Notes (Signed)
[name], NT  Michaelyn Barter, RN        Pam this pt is wanting a CT CA Score.   Will you put this order in? Pt has hyperlipidemia. I will CC Dr Brigitte Pulse.    Dr. Johnsie Cancel to read.

## 2017-06-20 ENCOUNTER — Ambulatory Visit (INDEPENDENT_AMBULATORY_CARE_PROVIDER_SITE_OTHER)
Admission: RE | Admit: 2017-06-20 | Discharge: 2017-06-20 | Disposition: A | Discharge status: Home / Self Care | Payer: Self-pay | Source: Ambulatory Visit | Attending: Cardiovascular Disease | Admitting: Cardiovascular Disease

## 2017-06-20 ENCOUNTER — Other Ambulatory Visit: Payer: Medicare Other

## 2017-06-20 DIAGNOSIS — E785 Hyperlipidemia, unspecified: Secondary | ICD-10-CM

## 2017-07-07 ENCOUNTER — Other Ambulatory Visit: Payer: Self-pay | Admitting: Internal Medicine

## 2017-07-07 DIAGNOSIS — Z1231 Encounter for screening mammogram for malignant neoplasm of breast: Secondary | ICD-10-CM

## 2017-08-03 ENCOUNTER — Ambulatory Visit: Payer: Medicare Other | Admitting: Podiatry

## 2017-08-17 ENCOUNTER — Encounter: Payer: Self-pay | Admitting: Podiatry

## 2017-08-17 ENCOUNTER — Ambulatory Visit (INDEPENDENT_AMBULATORY_CARE_PROVIDER_SITE_OTHER): Payer: Medicare Other | Admitting: Podiatry

## 2017-08-17 DIAGNOSIS — L84 Corns and callosities: Secondary | ICD-10-CM

## 2017-08-18 NOTE — Progress Notes (Signed)
Subjective:   Patient ID: [name], female   DOB: 77 y.o.   MRN: [id number]   HPI Patient presents with continued keratotic lesion sub-3 bilateral which is much better since surgery but still present   ROS      Objective:  Physical Exam  Neurovascular status intact with keratotic lesion sub-third metatarsal right over left     Assessment:  Lesion formation secondary to structural alignment that has improved but still present     Plan:  Debridement accomplished with no iatrogenic bleeding continue conservative care

## 2017-08-24 ENCOUNTER — Ambulatory Visit
Admission: RE | Admit: 2017-08-24 | Discharge: 2017-08-24 | Disposition: A | Discharge status: Home / Self Care | Payer: Medicare Other | Source: Ambulatory Visit | Attending: Internal Medicine | Admitting: Internal Medicine

## 2017-08-24 ENCOUNTER — Telehealth: Payer: Self-pay | Admitting: *Deleted

## 2017-08-24 DIAGNOSIS — Z1231 Encounter for screening mammogram for malignant neoplasm of breast: Secondary | ICD-10-CM

## 2017-08-24 NOTE — Telephone Encounter (Signed)
Called patient at [phone] (Cell #) and told her that Dr. Paulla Dolly would sell her a bag of 100 corn pads for 30 cents each. The total charge comes to $30.00.  Patient stated she wanted to buy these and would pick them up today (08/24/17) at 2:00 pm.  I told patient that I would leave them at the front desk. Pt stated she understood.

## 2017-11-16 ENCOUNTER — Ambulatory Visit (INDEPENDENT_AMBULATORY_CARE_PROVIDER_SITE_OTHER): Payer: Medicare Other | Admitting: Podiatry

## 2017-11-16 ENCOUNTER — Encounter: Payer: Self-pay | Admitting: Podiatry

## 2017-11-16 DIAGNOSIS — L84 Corns and callosities: Secondary | ICD-10-CM | POA: Diagnosis not present

## 2017-11-17 NOTE — Progress Notes (Signed)
Subjective:   Patient ID: [name], female   DOB: 77 y.o.   MRN: [id number]   HPI Patient states I had improvement in the calluses of started to recur   ROS      Objective:  Physical Exam  Neurovascular status intact with significant plantar calluses sub-third metatarsal bilateral     Assessment:  Chronic callus formation bilateral     Plan:  Debride chronic calluses with toleration well with no iatrogenic bleeding and reappoint for routine care

## 2018-01-30 DIAGNOSIS — E7849 Other hyperlipidemia: Secondary | ICD-10-CM | POA: Diagnosis not present

## 2018-01-30 DIAGNOSIS — R82998 Other abnormal findings in urine: Secondary | ICD-10-CM | POA: Diagnosis not present

## 2018-01-30 DIAGNOSIS — E559 Vitamin D deficiency, unspecified: Secondary | ICD-10-CM | POA: Diagnosis not present

## 2018-02-06 DIAGNOSIS — M859 Disorder of bone density and structure, unspecified: Secondary | ICD-10-CM | POA: Diagnosis not present

## 2018-02-06 DIAGNOSIS — H9190 Unspecified hearing loss, unspecified ear: Secondary | ICD-10-CM | POA: Diagnosis not present

## 2018-02-06 DIAGNOSIS — E559 Vitamin D deficiency, unspecified: Secondary | ICD-10-CM | POA: Diagnosis not present

## 2018-02-06 DIAGNOSIS — J3089 Other allergic rhinitis: Secondary | ICD-10-CM | POA: Diagnosis not present

## 2018-02-06 DIAGNOSIS — E7849 Other hyperlipidemia: Secondary | ICD-10-CM | POA: Diagnosis not present

## 2018-02-06 DIAGNOSIS — Z86010 Personal history of colonic polyps: Secondary | ICD-10-CM | POA: Diagnosis not present

## 2018-02-06 DIAGNOSIS — R05 Cough: Secondary | ICD-10-CM | POA: Diagnosis not present

## 2018-02-06 DIAGNOSIS — Z1389 Encounter for screening for other disorder: Secondary | ICD-10-CM | POA: Diagnosis not present

## 2018-02-06 DIAGNOSIS — Z0000 Encounter for general adult medical examination without abnormal findings: Secondary | ICD-10-CM | POA: Diagnosis not present

## 2018-02-07 DIAGNOSIS — Z1212 Encounter for screening for malignant neoplasm of rectum: Secondary | ICD-10-CM | POA: Diagnosis not present

## 2018-03-01 ENCOUNTER — Ambulatory Visit: Payer: PRIVATE HEALTH INSURANCE | Admitting: Podiatry

## 2018-03-15 ENCOUNTER — Encounter: Payer: Self-pay | Admitting: Podiatry

## 2018-03-15 ENCOUNTER — Ambulatory Visit: Payer: PRIVATE HEALTH INSURANCE | Admitting: Podiatry

## 2018-03-15 DIAGNOSIS — L84 Corns and callosities: Secondary | ICD-10-CM | POA: Diagnosis not present

## 2018-03-15 DIAGNOSIS — B351 Tinea unguium: Secondary | ICD-10-CM

## 2018-03-15 NOTE — Progress Notes (Signed)
Subjective:   Patient ID: [name], female   DOB: 78 y.o.   MRN: [id number]   HPI Patient presents with chronic lesion subsecond third metatarsal left that are painful and concerned about nail disease left big toe with changes in the nailbed   ROS      Objective:  Physical Exam  Neurovascular status intact with patient found to have keratotic lesion sub-third metatarsal bilateral and discolored nail left hallux with change     Assessment:  Corn callus formation secondary to bony structural position bilateral with mycotic nail infection left big toenail with probable trauma     Plan:  H&P discussed conditions and sharp sterile debridement accomplished lesions bilateral and for the nail left discussed soaks and possibility for removal if it should get worse

## 2018-06-14 ENCOUNTER — Ambulatory Visit: Payer: PRIVATE HEALTH INSURANCE | Admitting: Podiatry

## 2018-07-07 ENCOUNTER — Ambulatory Visit: Payer: Medicare Other | Admitting: Podiatry

## 2018-07-10 ENCOUNTER — Other Ambulatory Visit: Payer: Self-pay

## 2018-07-10 ENCOUNTER — Ambulatory Visit (INDEPENDENT_AMBULATORY_CARE_PROVIDER_SITE_OTHER): Payer: Medicare Other | Admitting: Podiatry

## 2018-07-10 ENCOUNTER — Encounter: Payer: Self-pay | Admitting: Podiatry

## 2018-07-10 VITALS — Temp 97.3°F

## 2018-07-10 DIAGNOSIS — L84 Corns and callosities: Secondary | ICD-10-CM | POA: Diagnosis not present

## 2018-07-12 NOTE — Progress Notes (Signed)
Subjective:   Patient ID: [name], female   DOB: 78 y.o.   MRN: [id number]   HPI Patient presents with painful plantar callus bilateral submetatarsal   ROS      Objective:  Physical Exam  Neurovascular status intact with keratotic lesion noted bilateral     Assessment:  Chronic lesion formation with pain bilateral     Plan:  Debrided lesion bilateral no iatrogenic bleeding and reappoint when symptoms recur

## 2018-07-18 ENCOUNTER — Other Ambulatory Visit: Payer: Self-pay | Admitting: Internal Medicine

## 2018-07-18 DIAGNOSIS — Z1231 Encounter for screening mammogram for malignant neoplasm of breast: Secondary | ICD-10-CM

## 2018-09-04 ENCOUNTER — Ambulatory Visit
Admission: RE | Admit: 2018-09-04 | Discharge: 2018-09-04 | Disposition: A | Discharge status: Home / Self Care | Payer: Medicare Other | Source: Ambulatory Visit | Attending: Internal Medicine | Admitting: Internal Medicine

## 2018-09-04 ENCOUNTER — Other Ambulatory Visit: Payer: Self-pay

## 2018-09-04 DIAGNOSIS — Z1231 Encounter for screening mammogram for malignant neoplasm of breast: Secondary | ICD-10-CM

## 2018-10-11 ENCOUNTER — Ambulatory Visit (INDEPENDENT_AMBULATORY_CARE_PROVIDER_SITE_OTHER): Payer: Medicare Other | Admitting: Podiatry

## 2018-10-11 ENCOUNTER — Other Ambulatory Visit: Payer: Self-pay

## 2018-10-11 ENCOUNTER — Encounter: Payer: Self-pay | Admitting: Podiatry

## 2018-10-11 VITALS — Temp 98.5°F

## 2018-10-11 DIAGNOSIS — L84 Corns and callosities: Secondary | ICD-10-CM | POA: Diagnosis not present

## 2018-10-11 NOTE — Progress Notes (Signed)
Subjective:   Patient ID: [name], female   DOB: 78 y.o.   MRN: [id number]   HPI Patient presents with chronic lesions bottom of both third metatarsal that are very painful   ROS      Objective:  Physical Exam  Chronic callus tissue plantar aspect both feet     Assessment:  Lesions that are very painful when palpated plantar third metatarsal bilateral     Plan:  Sterile debridement of all lesions no iatrogenic bleeding and reappoint appointment for preoperative check

## 2018-11-28 DIAGNOSIS — R300 Dysuria: Secondary | ICD-10-CM | POA: Diagnosis not present

## 2019-01-10 ENCOUNTER — Other Ambulatory Visit: Payer: Self-pay

## 2019-01-10 ENCOUNTER — Ambulatory Visit: Payer: Medicare Other | Admitting: Podiatry

## 2019-01-10 ENCOUNTER — Encounter: Payer: Self-pay | Admitting: Podiatry

## 2019-01-10 DIAGNOSIS — L84 Corns and callosities: Secondary | ICD-10-CM

## 2019-01-10 NOTE — Progress Notes (Signed)
Subjective:   Patient ID: [name], female   DOB: 78 y.o.   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

## 2019-04-11 ENCOUNTER — Other Ambulatory Visit: Payer: Self-pay

## 2019-04-11 ENCOUNTER — Ambulatory Visit: Payer: Medicare Other | Admitting: Podiatry

## 2019-04-11 ENCOUNTER — Encounter: Payer: Self-pay | Admitting: Podiatry

## 2019-04-11 DIAGNOSIS — L84 Corns and callosities: Secondary | ICD-10-CM

## 2019-04-11 NOTE — Progress Notes (Signed)
Subjective:   Patient ID: [name], female   DOB: 79 y.o.   MRN: [id number]   HPI Patient presents with severe callus formation plantar bilateral   ROS      Objective:  Physical Exam  None neurovascular status intact severe keratotic lesions of the second third metatarsal bilateral     Assessment:  Chronic callus formation     Plan:  Debridement of lesions done no iatrogenic bleeding reappoint for routine care

## 2019-04-12 ENCOUNTER — Ambulatory Visit: Payer: Medicare Other | Admitting: Podiatry

## 2019-04-25 ENCOUNTER — Telehealth: Payer: Self-pay | Admitting: *Deleted

## 2019-04-25 NOTE — Telephone Encounter (Signed)
Called patient at [phone] to let them know their foam pads came in.  Patient wanted 100 pads total.  Patient will be coming next Wednesday, May 02, 2019 to pick up. I told patient that I will have them at the front desk and she will need to get from them.  Patient stated they understood.

## 2019-07-11 ENCOUNTER — Other Ambulatory Visit: Payer: Self-pay

## 2019-07-11 ENCOUNTER — Encounter: Payer: Self-pay | Admitting: Podiatry

## 2019-07-11 ENCOUNTER — Ambulatory Visit: Payer: Medicare Other | Admitting: Podiatry

## 2019-07-11 DIAGNOSIS — L84 Corns and callosities: Secondary | ICD-10-CM | POA: Diagnosis not present

## 2019-07-11 NOTE — Progress Notes (Signed)
Subjective:   Patient ID: [name], female   DOB: 79 y.o.   MRN: [id number]   HPI Should presents with chronic lesion formation bilateral plantar feet   ROS      Objective:  Physical Exam  Neurovascular status intact with chronic keratotic lesion bilateral     Assessment:  Chronic lesion secondary to foot structure bilateral     Plan:  Debridement accomplished no iatrogenic bleeding reappoint routine care approximately 3 months and patient is moving to [facility] and may be seen in [place]

## 2019-07-31 ENCOUNTER — Other Ambulatory Visit: Payer: Self-pay | Admitting: Internal Medicine

## 2019-07-31 DIAGNOSIS — Z1231 Encounter for screening mammogram for malignant neoplasm of breast: Secondary | ICD-10-CM

## 2019-09-12 ENCOUNTER — Ambulatory Visit: Payer: Medicare Other

## 2019-09-17 ENCOUNTER — Ambulatory Visit: Payer: Medicare Other

## 2019-09-22 ENCOUNTER — Other Ambulatory Visit: Payer: Self-pay

## 2019-09-22 ENCOUNTER — Emergency Department
Admission: EM | Admit: 2019-09-22 | Discharge: 2019-09-22 | Disposition: A | Discharge status: Home / Self Care | Payer: Medicare Other | Attending: Emergency Medicine | Admitting: Emergency Medicine

## 2019-09-22 ENCOUNTER — Encounter: Payer: Self-pay | Admitting: Emergency Medicine

## 2019-09-22 DIAGNOSIS — Z79899 Other long term (current) drug therapy: Secondary | ICD-10-CM | POA: Insufficient documentation

## 2019-09-22 DIAGNOSIS — W269XXA Contact with unspecified sharp object(s), initial encounter: Secondary | ICD-10-CM | POA: Insufficient documentation

## 2019-09-22 DIAGNOSIS — S61012A Laceration without foreign body of left thumb without damage to nail, initial encounter: Secondary | ICD-10-CM | POA: Diagnosis not present

## 2019-09-22 DIAGNOSIS — Z23 Encounter for immunization: Secondary | ICD-10-CM | POA: Diagnosis not present

## 2019-09-22 DIAGNOSIS — Y999 Unspecified external cause status: Secondary | ICD-10-CM | POA: Insufficient documentation

## 2019-09-22 DIAGNOSIS — Y929 Unspecified place or not applicable: Secondary | ICD-10-CM | POA: Insufficient documentation

## 2019-09-22 DIAGNOSIS — Y93G9 Activity, other involving cooking and grilling: Secondary | ICD-10-CM | POA: Insufficient documentation

## 2019-09-22 DIAGNOSIS — S6992XA Unspecified injury of left wrist, hand and finger(s), initial encounter: Secondary | ICD-10-CM | POA: Diagnosis present

## 2019-09-22 MED ORDER — LIDOCAINE HCL 1 % IJ SOLN
5.0000 mL | Freq: Once | INTRAMUSCULAR | Status: AC
Start: 2019-09-22 — End: 2019-09-22
  Administered 2019-09-22: 17:00:00 5 mL
  Filled 2019-09-22: qty 10

## 2019-09-22 MED ORDER — TETANUS-DIPHTH-ACELL PERTUSSIS 5-2.5-18.5 LF-MCG/0.5 IM SUSP
0.5000 mL | Freq: Once | INTRAMUSCULAR | Status: AC
  Administered 2019-09-22: 17:00:00 0.5 mL via INTRAMUSCULAR
  Filled 2019-09-22: qty 0.5

## 2019-09-22 MED ORDER — CEPHALEXIN 500 MG PO CAPS: 500.0000 mg | ORAL_CAPSULE | Freq: Three times a day (TID) | ORAL | 0 refills | Status: AC

## 2019-09-22 NOTE — ED Triage Notes (Signed)
Pt presents to ED via POV with c/o L thumb laceration. Pt states was slicing watermelons, unsure when last tetanus shot was. A&O x4. NAD noted at this time.

## 2019-09-22 NOTE — ED Provider Notes (Signed)
Emergency Department Provider Note  ____________________________________________  Time seen: Approximately 3:49 PM  I have reviewed the triage vital signs and the nursing notes.   HISTORY  Chief Complaint Laceration   Historian Patient     HPI [name] is a 79 y.o. female presents to the emergency department with a 2-1/2 cm laceration across the volar aspect of the left thumb sustained accidentally while cutting watermelon.  Patient cannot recall her last tetanus shot. No numbness or tinging of the left thumb. No similar injuries in the past. No other alleviating measures have been attempted.    History reviewed. No pertinent past medical history.   Immunizations up to date:  Yes.     History reviewed. No pertinent past medical history.  There are no problems to display for this patient.   History reviewed. No pertinent surgical history.  Prior to Admission medications   Medication Sig Start Date End Date Taking? Authorizing Provider  alendronate (FOSAMAX) 70 MG tablet Take 70 mg by mouth once a week. 05/23/19   [provider]  cephALEXin (KEFLEX) 500 MG capsule Take 1 capsule (500 mg total) by mouth 3 (three) times daily for 7 days. 09/22/19 09/29/19  Lannie Fields, PA-C  Cholecalciferol (VITAMIN D PO) Take by mouth.    [provider]    Allergies Ampicillin and Crestor [rosuvastatin calcium]  Family History  Problem Relation Age of Onset  . Breast cancer Neg Hx     Social History Social History   Tobacco Use  . Smoking status: Never Smoker  . Smokeless tobacco: Never Used  Substance Use Topics  . Alcohol use: No  . Drug use: No     Review of Systems  Constitutional: No fever/chills Eyes:  No discharge ENT: No upper respiratory complaints. Respiratory: no cough. No SOB/ use of accessory muscles to breath Gastrointestinal:   No nausea, no vomiting.  No diarrhea.  No constipation. Musculoskeletal: Patient has  laceration.  Skin: Negative for rash, abrasions, lacerations, ecchymosis.    ____________________________________________   PHYSICAL EXAM:  VITAL SIGNS: ED Triage Vitals  Enc Vitals Group     BP 09/22/19 1420 (!) 135/74     Pulse Rate 09/22/19 1420 64     Resp 09/22/19 1420 20     Temp 09/22/19 1420 98.4 F (36.9 C)     Temp Source 09/22/19 1420 Oral     SpO2 09/22/19 1420 97 %     Weight 09/22/19 1421 160 lb 0.9 oz (72.6 kg)     Height 09/22/19 1421 5' 6.5" (1.689 m)     Head Circumference --      Peak Flow --      Pain Score 09/22/19 1421 0     Pain Loc --      Pain Edu? --      Excl. in [place]? --      Constitutional: Alert and oriented. Well appearing and in no acute distress. Eyes: Conjunctivae are normal. PERRL. EOMI. Head: Atraumatic. Cardiovascular: Normal rate, regular rhythm. Normal S1 and S2.  Good peripheral circulation. Respiratory: Normal respiratory effort without tachypnea or retractions. Lungs CTAB. Good air entry to the bases with no decreased or absent breath sounds Gastrointestinal: Bowel sounds x 4 quadrants. Soft and nontender to palpation. No guarding or rigidity. No distention. Musculoskeletal: No flexor or extensor tendon deficits of the left thumb.  Neurologic:  Normal for age. No gross focal neurologic deficits are appreciated.  Skin: Patient has a 2 and half centimeter  laceration across the volar aspect of the left thumb.  Capillary refill less than 2 seconds.  Palpable radial pulse, left. Psychiatric: Mood and affect are normal for age. Speech and behavior are normal.   ____________________________________________   LABS (all labs ordered are listed, but only abnormal results are displayed)  Labs Reviewed - No data to display ____________________________________________  EKG   ____________________________________________  RADIOLOGY  No results found.  ____________________________________________    PROCEDURES  Procedure(s)  performed:     Marland KitchenMarland KitchenLaceration Repair  Date/Time: 09/22/2019 4:07 PM Performed by: Lannie Fields, PA-C Authorized by: Lannie Fields, PA-C   Consent:    Consent obtained:  Verbal   Consent given by:  Patient   Risks discussed:  Infection, pain, retained foreign body, poor cosmetic result and poor wound healing Anesthesia (see MAR for exact dosages):    Anesthesia method:  Local infiltration   Local anesthetic:  Lidocaine 1% w/o epi Laceration details:    Location:  Finger   Finger location:  L thumb   Length (cm):  2.5   Depth (mm):  5 Repair type:    Repair type:  Simple Exploration:    Hemostasis achieved with:  Direct pressure   Wound exploration: entire depth of wound probed and visualized     Contaminated: no   Treatment:    Area cleansed with:  Saline and Betadine   Amount of cleaning:  Extensive   Irrigation solution:  Sterile saline   Irrigation volume:  500   Visualized foreign bodies/material removed: no   Skin repair:    Repair method:  Sutures   Suture size:  4-0   Suture technique:  Simple interrupted   Number of sutures:  6 Approximation:    Approximation:  Close Post-procedure details:    Dressing:  Sterile dressing   Patient tolerance of procedure:  Tolerated well, no immediate complications       Medications  lidocaine (XYLOCAINE) 1 % (with pres) injection 5 mL (5 mLs Infiltration Given 09/22/19 1650)  Tdap (BOOSTRIX) injection 0.5 mL (0.5 mLs Intramuscular Given 09/22/19 1646)     ____________________________________________   INITIAL IMPRESSION / ASSESSMENT AND PLAN / ED COURSE  Pertinent labs & imaging results that were available during my care of the patient were reviewed by me and considered in my medical decision making (see chart for details).      Assessment and plan Laceration 79 year old female presents to the emergency department with a left thumb laceration.  Laceration was repaired in the emergency department without  complication.  She was advised to have external sutures removed by primary care in 7 days.  Tetanus status was updated in the emergency department.  She was discharged with Keflex. Return precautions were given to return with new or worsening symptoms.   ____________________________________________  FINAL CLINICAL IMPRESSION(S) / ED DIAGNOSES  Final diagnoses:  Laceration of left thumb without foreign body without damage to nail, initial encounter      NEW MEDICATIONS STARTED DURING THIS VISIT:  ED Discharge Orders         Ordered    cephALEXin (KEFLEX) 500 MG capsule  3 times daily     Discontinue  Reprint     09/22/19 1552              This chart was dictated using voice recognition software/Dragon. Despite best efforts to proofread, errors can occur which can change the meaning. Any change was purely unintentional.     Lannie Fields, PA-C  09/22/19 1652    Blake Divine, MD 09/24/19 [phone]

## 2019-09-22 NOTE — Discharge Instructions (Signed)
Keep wound clean and dry for the next twenty four hours.  Have sutures removed in seven days.  Take Keflex three times daily for the next seven days.

## 2019-09-22 NOTE — ED Notes (Signed)
See triage note  Presents with laceration to left thumb  States she was slicing a watermelon  Knife slipped

## 2019-09-28 ENCOUNTER — Other Ambulatory Visit: Payer: Self-pay

## 2019-09-28 ENCOUNTER — Ambulatory Visit
Admission: RE | Admit: 2019-09-28 | Discharge: 2019-09-28 | Disposition: A | Discharge status: Home / Self Care | Payer: Medicare Other | Source: Ambulatory Visit | Attending: Internal Medicine | Admitting: Internal Medicine

## 2019-09-28 DIAGNOSIS — Z1231 Encounter for screening mammogram for malignant neoplasm of breast: Secondary | ICD-10-CM

## 2019-10-12 ENCOUNTER — Ambulatory Visit: Payer: Medicare Other | Admitting: Podiatry

## 2019-10-17 ENCOUNTER — Other Ambulatory Visit: Payer: Self-pay

## 2019-10-17 ENCOUNTER — Encounter: Payer: Self-pay | Admitting: Podiatry

## 2019-10-17 ENCOUNTER — Ambulatory Visit: Payer: Medicare Other | Admitting: Podiatry

## 2019-10-17 VITALS — Temp 96.3°F

## 2019-10-17 DIAGNOSIS — L84 Corns and callosities: Secondary | ICD-10-CM

## 2019-10-17 NOTE — Progress Notes (Signed)
Subjective:   Patient ID: [name], female   DOB: 79 y.o.   MRN: [id number]   HPI Patient presents with 2 chronic lesions plantar aspect left foot that are sore   ROS      Objective:  Physical Exam  Neurovascular status with lesions left one on the right that are painful when pressed     Assessment:  Chronic lesion secondary to bone pressure     Plan:  Debridement of lesions bilateral no iatrogenic bleeding reappoint routine care

## 2020-01-21 ENCOUNTER — Ambulatory Visit: Payer: Medicare Other | Admitting: Podiatry

## 2020-01-21 ENCOUNTER — Other Ambulatory Visit: Payer: Self-pay

## 2020-01-21 ENCOUNTER — Encounter: Payer: Self-pay | Admitting: Podiatry

## 2020-01-21 DIAGNOSIS — L84 Corns and callosities: Secondary | ICD-10-CM

## 2020-01-21 DIAGNOSIS — M2042 Other hammer toe(s) (acquired), left foot: Secondary | ICD-10-CM | POA: Diagnosis not present

## 2020-01-21 DIAGNOSIS — M2041 Other hammer toe(s) (acquired), right foot: Secondary | ICD-10-CM

## 2020-01-22 NOTE — Progress Notes (Signed)
Subjective:   Patient ID: [name], female   DOB: 79 y.o.   MRN: [id number]   HPI Patient states the pain seems to be intensifying and she does want her nose or anything else that we could consider to try to treat the chronic callus formation bilateral    ROS      Objective:  Physical Exam  Neurovascular status intact with plantar callus found to be quite severe bilateral with pain and digital deformities bilateral with prominent metatarsals and diminished fat pad     Assessment:  Significant forefoot malalignment bilateral with chronic lesion formation secondary to structural deformity     Plan:  H&P revealed structural distal deformity and discussed the consideration for Tretha Sciara procedure which is the only way we could alleviate everything she is experiencing. At this point she is going to think about it and today deep debridement accomplished and we will see her back when she is symptomatic

## 2020-03-03 ENCOUNTER — Ambulatory Visit: Payer: Medicare Other | Admitting: Podiatry

## 2020-03-10 ENCOUNTER — Ambulatory Visit: Payer: Medicare Other | Admitting: Podiatry

## 2020-03-17 ENCOUNTER — Ambulatory Visit: Payer: Medicare Other | Admitting: Podiatry

## 2020-03-17 ENCOUNTER — Other Ambulatory Visit: Payer: Self-pay

## 2020-03-17 ENCOUNTER — Encounter: Payer: Self-pay | Admitting: Podiatry

## 2020-03-17 DIAGNOSIS — L84 Corns and callosities: Secondary | ICD-10-CM

## 2020-03-17 NOTE — Progress Notes (Signed)
Subjective:   Patient ID: [name], female   DOB: 80 y.o.   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

## 2020-05-12 ENCOUNTER — Ambulatory Visit: Payer: Medicare Other | Admitting: Podiatry

## 2020-05-12 ENCOUNTER — Other Ambulatory Visit: Payer: Self-pay

## 2020-05-12 ENCOUNTER — Encounter: Payer: Self-pay | Admitting: Podiatry

## 2020-05-12 DIAGNOSIS — M2041 Other hammer toe(s) (acquired), right foot: Secondary | ICD-10-CM | POA: Diagnosis not present

## 2020-05-12 DIAGNOSIS — L84 Corns and callosities: Secondary | ICD-10-CM

## 2020-05-12 NOTE — Progress Notes (Signed)
Subjective:   Patient ID: Gearldine Bienenstock, female   DOB: 80 y.o.   MRN: [id number]   HPI Patient states the lesions are really bothering her and she is interested is anything that we can do that would be more long-term nerve   ROS      Objective:  Physical Exam  Vascular status intact with patient continuing to have plantar keratotic lesion subthird metatarsal right over left with diminished fat pad and exposed bones.  Patient states it is been very tender     Assessment:  Keratotic lesion formation painful bilateral with significant foot structural changes     Plan:  H&P conditions reviewed.  At this point I have recommended continued debridement technique and I discussed the possibilities for surgery which would require met head resection with a Tretha Sciara procedure.  I educated her on what would be required who could consider this and if we do it we will do it in the fall

## 2020-07-14 ENCOUNTER — Ambulatory Visit: Payer: Medicare Other | Admitting: Podiatry

## 2020-07-14 ENCOUNTER — Other Ambulatory Visit: Payer: Self-pay

## 2020-07-14 ENCOUNTER — Encounter: Payer: Self-pay | Admitting: Podiatry

## 2020-07-14 DIAGNOSIS — L84 Corns and callosities: Secondary | ICD-10-CM

## 2020-07-14 NOTE — Progress Notes (Signed)
Subjective:   Patient ID: [name], female   DOB: 80 y.o.   MRN: [id number]   HPI Patient presents chronic lesions of third metatarsal bilateral   ROS      Objective:  Physical Exam  Neurovascular status intact chronic keratotic lesion bilateral     Assessment:  Chronic lesion formation with plantarflexed metatarsals bilateral     Plan:  Sharp sterile debridement occurred bilateral no iatrogenic bleeding reappoint routine care

## 2020-08-21 ENCOUNTER — Other Ambulatory Visit: Payer: Self-pay | Admitting: Internal Medicine

## 2020-08-21 DIAGNOSIS — Z1231 Encounter for screening mammogram for malignant neoplasm of breast: Secondary | ICD-10-CM

## 2020-09-15 ENCOUNTER — Encounter: Payer: Self-pay | Admitting: Podiatry

## 2020-09-15 ENCOUNTER — Other Ambulatory Visit: Payer: Self-pay

## 2020-09-15 ENCOUNTER — Ambulatory Visit: Payer: Medicare Other | Admitting: Podiatry

## 2020-09-15 DIAGNOSIS — L84 Corns and callosities: Secondary | ICD-10-CM

## 2020-09-15 NOTE — Progress Notes (Signed)
Subjective:   Patient ID: [name], female   DOB: 80 y.o.   MRN: [id number]   HPI Patient presents stating she is here for callus formation chronic bilateral and would like to see Dr. In [place] as she lives very close to that office.  She had had surgery done several years ago which has reduced her symptoms on the right but she continues to get chronic callus formation bilateral   ROS      Objective:  Physical Exam  Neurovascular status intact with keratotic lesion plantar aspect third metatarsal bilateral moderately painful when pressed     Assessment:  Chronic lesion formation bilateral     Plan:  Sterile debridement sharp lesions bilateral continue with thick bottom shoes reappoint as needed

## 2020-10-15 ENCOUNTER — Other Ambulatory Visit: Payer: Self-pay

## 2020-10-15 ENCOUNTER — Ambulatory Visit
Admission: RE | Admit: 2020-10-15 | Discharge: 2020-10-15 | Disposition: A | Discharge status: Home / Self Care | Payer: Medicare Other | Source: Ambulatory Visit | Attending: Internal Medicine | Admitting: Internal Medicine

## 2020-10-15 DIAGNOSIS — Z1231 Encounter for screening mammogram for malignant neoplasm of breast: Secondary | ICD-10-CM

## 2020-11-10 ENCOUNTER — Ambulatory Visit: Payer: Medicare Other | Admitting: Podiatry

## 2020-11-10 ENCOUNTER — Other Ambulatory Visit: Payer: Self-pay

## 2020-11-10 ENCOUNTER — Encounter: Payer: Self-pay | Admitting: Podiatry

## 2020-11-10 DIAGNOSIS — L84 Corns and callosities: Secondary | ICD-10-CM

## 2020-11-10 NOTE — Progress Notes (Signed)
Subjective:   Patient ID: [name], female   DOB: 80 y.o.   MRN: [id number]   HPI Patient presents stating I have painful lesions on both my feet which did not respond as well the last time   ROS      Objective:  Physical Exam  Chronic keratotic lesion bilateral plantar feet     Assessment:  Lesion formation secondary to bone structural issues     Plan:  Debridement bilateral small amount of bleeding right applied sterile dressing and advised on soaks and padding and reappoint for routine care as needed

## 2021-01-12 ENCOUNTER — Ambulatory Visit: Payer: Medicare Other | Admitting: Podiatry

## 2021-01-12 ENCOUNTER — Encounter: Payer: Self-pay | Admitting: Podiatry

## 2021-01-12 ENCOUNTER — Other Ambulatory Visit: Payer: Self-pay

## 2021-01-12 DIAGNOSIS — L84 Corns and callosities: Secondary | ICD-10-CM

## 2021-01-12 NOTE — Progress Notes (Signed)
Subjective:   Patient ID: [name], female   DOB: 80 y.o.   MRN: [id number]   HPI Patient presents stating his quads are very sore making it hard for her to walk   ROS      Objective:  Physical Exam  Lucent keratotic lesions submetatarsal 3 bilateral painful     Assessment:  Chronic lesion formation submetatarsal bilateral     Plan:  Sterile debridement of lesions no iatrogenic bleeding reappoint routine care

## 2021-03-16 ENCOUNTER — Ambulatory Visit: Payer: Medicare Other | Admitting: Podiatry

## 2021-03-16 ENCOUNTER — Other Ambulatory Visit: Payer: Self-pay

## 2021-03-16 ENCOUNTER — Encounter: Payer: Self-pay | Admitting: Podiatry

## 2021-03-16 DIAGNOSIS — L84 Corns and callosities: Secondary | ICD-10-CM

## 2021-03-16 NOTE — Progress Notes (Signed)
Subjective:   Patient ID: [name], female   DOB: 81 y.o.   MRN: [id number]   HPI Patient presents with lesions underneath both feet that are sore and make walking difficult   ROS      Objective:  Physical Exam  Neurovascular status intact chronic lesion formation bilateral third metatarsal     Assessment:  Lesions consistent with structural deformity     Plan:  Debridement of lesions no iatrogenic bleeding reappoint as needed

## 2021-06-15 ENCOUNTER — Ambulatory Visit: Payer: Medicare Other | Admitting: Podiatry

## 2021-06-15 ENCOUNTER — Encounter: Payer: Self-pay | Admitting: Podiatry

## 2021-06-15 DIAGNOSIS — L84 Corns and callosities: Secondary | ICD-10-CM | POA: Diagnosis not present

## 2021-06-15 NOTE — Progress Notes (Signed)
Subjective:  ? ?Patient ID: [name], female   DOB: 81 y.o.   MRN: [id number]  ? 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 ? ?ROS ? ? ?   ?Objective:  ?Physical Exam  ?Neurovascular status intact chronic keratotic lesion submetatarsal bilateral ? ?   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 ?   ?Plan:  ?Sharp sterile debridement no iatrogenic bleeding reappoint routine care ?   ? ? ?

## 2021-08-10 ENCOUNTER — Ambulatory Visit: Payer: Medicare Other | Admitting: Podiatry

## 2021-08-20 ENCOUNTER — Ambulatory Visit: Payer: Medicare Other | Admitting: Podiatry

## 2021-08-20 ENCOUNTER — Encounter: Payer: Self-pay | Admitting: Podiatry

## 2021-08-20 DIAGNOSIS — L84 Corns and callosities: Secondary | ICD-10-CM

## 2021-08-20 NOTE — Progress Notes (Signed)
Subjective:   Patient ID: [name], female   DOB: 81 y.o.   MRN: [id number]   HPI Patient presents with chronic painful calluses plantar aspect both feet that are painful and hard to walk on   ROS      Objective:  Physical Exam  Neurovascular status intact with chronic keratotic lesion formation of the plantar foot bilateral     Assessment:  Chronic lesion secondary to foot structure bilateral     Plan:  Debridement painful lesions bilateral no angiogenic bleeding reappoint routine care

## 2021-09-28 ENCOUNTER — Other Ambulatory Visit: Payer: Self-pay | Admitting: Internal Medicine

## 2021-09-28 DIAGNOSIS — Z1231 Encounter for screening mammogram for malignant neoplasm of breast: Secondary | ICD-10-CM

## 2021-10-21 ENCOUNTER — Ambulatory Visit
Admission: RE | Admit: 2021-10-21 | Discharge: 2021-10-21 | Disposition: A | Discharge status: Home / Self Care | Payer: Medicare Other | Source: Ambulatory Visit | Attending: Internal Medicine | Admitting: Internal Medicine

## 2021-10-21 DIAGNOSIS — Z1231 Encounter for screening mammogram for malignant neoplasm of breast: Secondary | ICD-10-CM | POA: Diagnosis present

## 2021-10-22 ENCOUNTER — Ambulatory Visit: Payer: Medicare Other | Admitting: Podiatry

## 2021-10-22 ENCOUNTER — Encounter: Payer: Self-pay | Admitting: Podiatry

## 2021-10-22 DIAGNOSIS — D2372 Other benign neoplasm of skin of left lower limb, including hip: Secondary | ICD-10-CM

## 2021-10-22 DIAGNOSIS — L84 Corns and callosities: Secondary | ICD-10-CM

## 2021-10-22 NOTE — Progress Notes (Signed)
Subjective:   Patient ID: [name], female   DOB: 81 y.o.   MRN: [id number]   HPI Patient presents with lesions plantar aspect second metatarsal both feet painful when pressed   ROS      Objective:  Physical Exam  Chronic lesion formation second metatarsal bilateral painful     Assessment:  Lesion secondary to bone structure     Plan:  Debridement lesions bilateral nitrogen and bleeding reappoint routine care

## 2022-01-21 ENCOUNTER — Ambulatory Visit: Payer: Medicare Other | Admitting: Podiatry

## 2022-01-21 ENCOUNTER — Encounter: Payer: Self-pay | Admitting: Podiatry

## 2022-01-21 DIAGNOSIS — L84 Corns and callosities: Secondary | ICD-10-CM

## 2022-01-22 NOTE — Progress Notes (Signed)
Subjective:   Patient ID: [name], female   DOB: 81 y.o.   MRN: [id number]   HPI Patient presents with chronic callus corn formation plantar aspect of the   ROS      Objective:  Physical Exam  Neurovascular status intact with keratotic lesion plantar aspect bilateral     Assessment:  Chronic keratotic lesion painful bilateral     Plan:  Sterile debridement bilateral no angiogenic bleeding reappoint routine care

## 2022-03-17 ENCOUNTER — Ambulatory Visit: Payer: Medicare Other | Admitting: Podiatry

## 2022-03-31 ENCOUNTER — Ambulatory Visit: Payer: Medicare Other | Admitting: Podiatry

## 2022-03-31 ENCOUNTER — Encounter: Payer: Self-pay | Admitting: Podiatry

## 2022-03-31 DIAGNOSIS — L84 Corns and callosities: Secondary | ICD-10-CM | POA: Diagnosis not present

## 2022-03-31 NOTE — Progress Notes (Signed)
Subjective:   Patient ID: [name], female   DOB: 82 y.o.   MRN: [id number]   HPI Patient presents with chronic lesions on the bottom of both feet that are sore   ROS      Objective:  Physical Exam  Neurovascular status intact chronic keratotic lesion subsecond third metatarsal bilateral painful     Assessment:  Chronic lesion secondary to bone with diminished fat pad     Plan:  Discussed the possibility for leneva for fat augmentation and at this time debrided lesions no iatrogenic bleeding reappoint as needed going on a cruise in April

## 2022-06-30 ENCOUNTER — Ambulatory Visit: Payer: Medicare Other | Admitting: Podiatry

## 2022-06-30 ENCOUNTER — Encounter: Payer: Self-pay | Admitting: Podiatry

## 2022-06-30 DIAGNOSIS — L84 Corns and callosities: Secondary | ICD-10-CM

## 2022-06-30 NOTE — Progress Notes (Signed)
Subjective:   Patient ID: [name], female   DOB: 82 y.o.   MRN: [id number]   HPI Patient presents with severe lesion formation plantar aspect both feet   ROS      Objective:  Physical Exam  Neurovascular status intact muscle strength adequate severe keratotic lesion submetatarsals bilateral with prominent metatarsal bone exposure      Assessment:  Reviewed that this is secondary to bone structure creating significant callus formation bilateral     Plan:  Debridement painful lesions bilateral no angiogenic bleeding reappoint routine care

## 2022-09-15 ENCOUNTER — Other Ambulatory Visit: Payer: Self-pay | Admitting: Internal Medicine

## 2022-09-15 DIAGNOSIS — Z1231 Encounter for screening mammogram for malignant neoplasm of breast: Secondary | ICD-10-CM

## 2022-09-30 ENCOUNTER — Encounter: Payer: Self-pay | Admitting: Podiatry

## 2022-09-30 ENCOUNTER — Ambulatory Visit: Payer: Medicare Other | Admitting: Podiatry

## 2022-09-30 DIAGNOSIS — L84 Corns and callosities: Secondary | ICD-10-CM | POA: Diagnosis not present

## 2022-09-30 NOTE — Progress Notes (Signed)
Subjective:   Patient ID: [name], female   DOB: 82 y.o.   MRN: [id number]   HPI Patient presents with lesions plantar aspect both the   ROS      Objective:  Physical Exam  Neurovascular status intact keratotic lesion subsecond metatarsal bilateral     Assessment:  Chronic lesion formation bilateral     Plan:  Debridement painful lesions reappoint routine care

## 2022-10-26 ENCOUNTER — Ambulatory Visit
Admission: RE | Admit: 2022-10-26 | Discharge: 2022-10-26 | Disposition: A | Discharge status: Home / Self Care | Payer: Medicare Other | Source: Ambulatory Visit | Attending: Internal Medicine | Admitting: Internal Medicine

## 2022-10-26 DIAGNOSIS — Z1231 Encounter for screening mammogram for malignant neoplasm of breast: Secondary | ICD-10-CM | POA: Diagnosis present

## 2022-10-27 IMAGING — MG MM DIGITAL SCREENING BILAT W/ TOMO AND CAD
8 series · 8 of 24 positions shown · non-contrast
Comparison: Previous exam(s).

CLINICAL DATA: Screening.

EXAM:
DIGITAL SCREENING BILATERAL MAMMOGRAM WITH TOMOSYNTHESIS AND CAD
TECHNIQUE: Bilateral screening digital craniocaudal and mediolateral oblique
mammograms were obtained. Bilateral screening digital breast
tomosynthesis was performed. The images were evaluated with
computer-aided detection.

[L CC synth-2D]
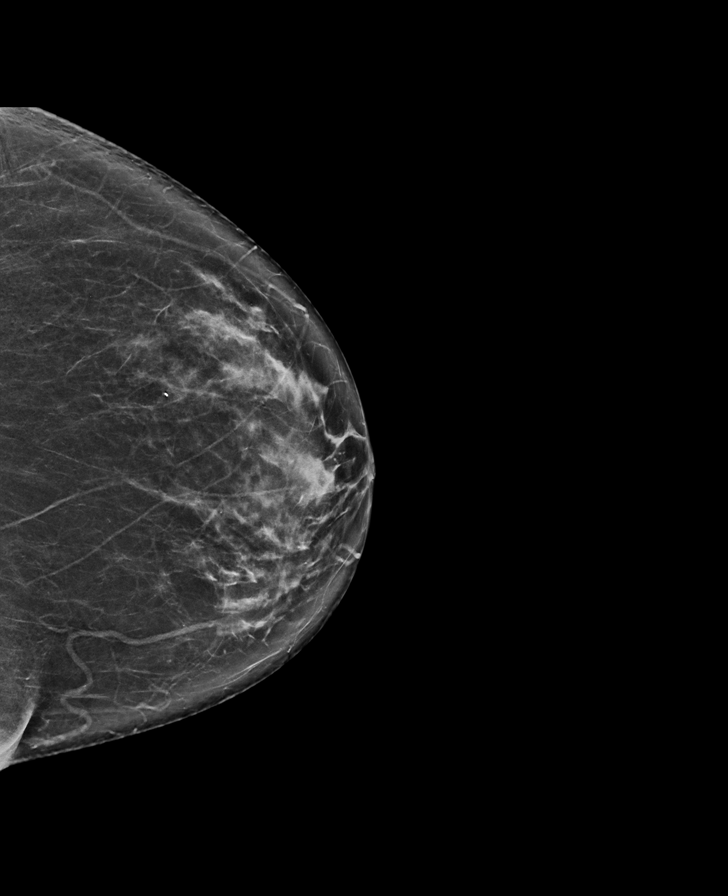

[L MLO synth-2D]
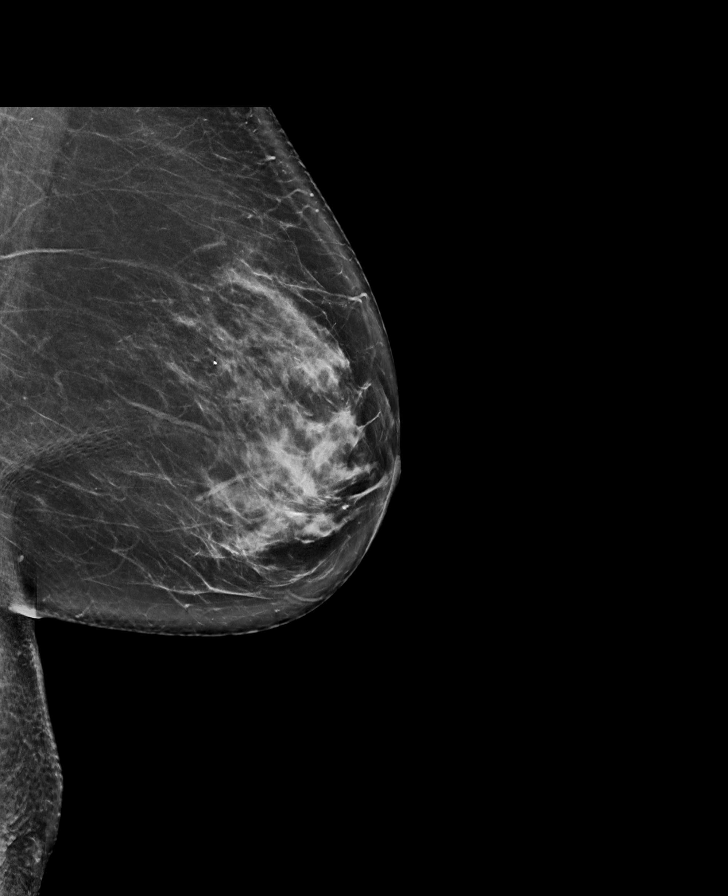

[R CC synth-2D]
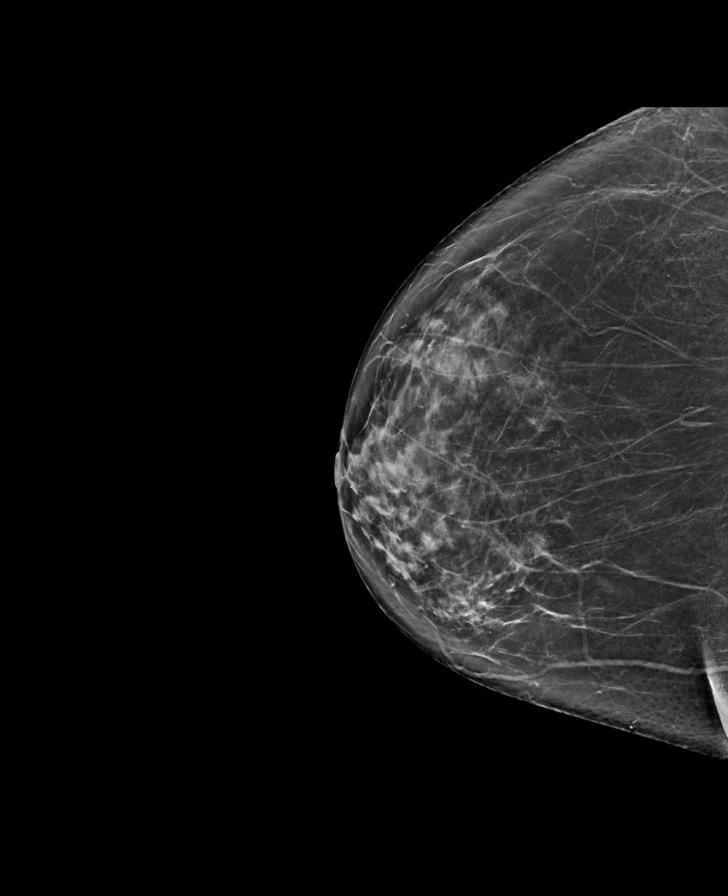

[R MLO synth-2D]
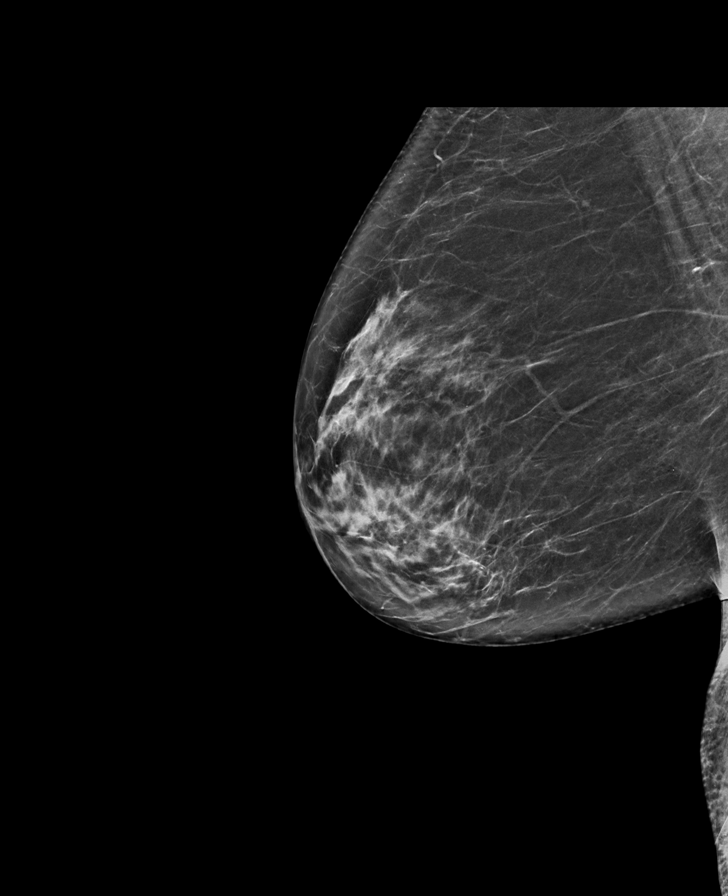

[R MLO tomo · tomo slice 39/78.0]
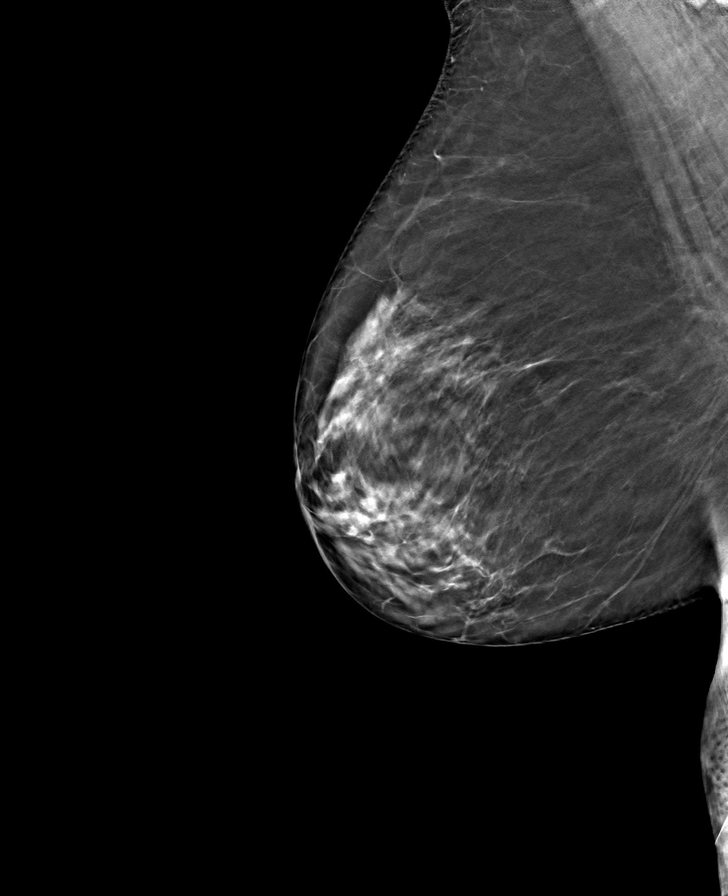

[L MLO tomo · tomo slice 43/85.0]
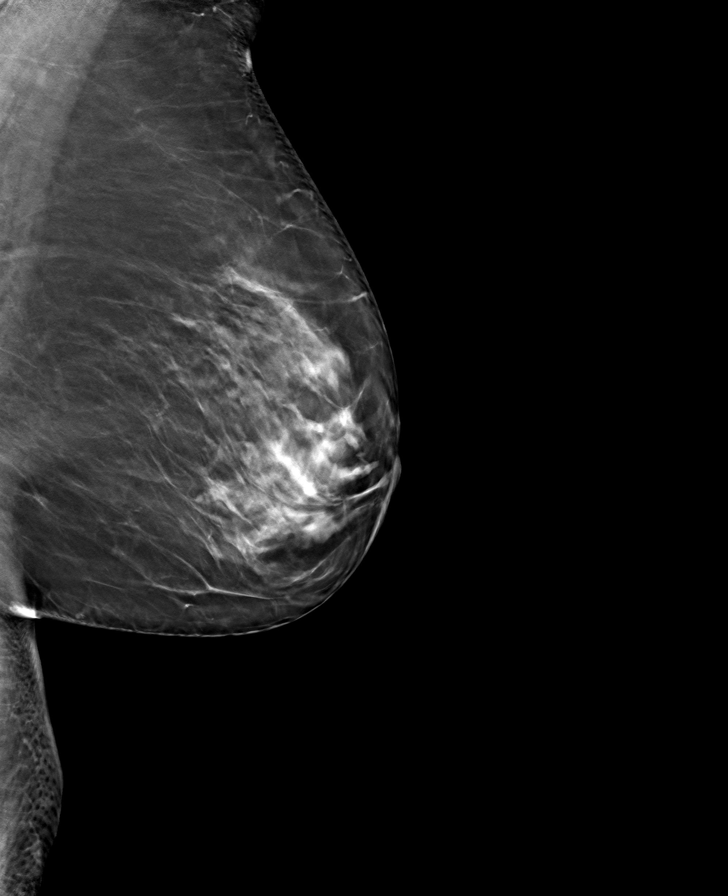

[R CC tomo · tomo slice 38/75.0]
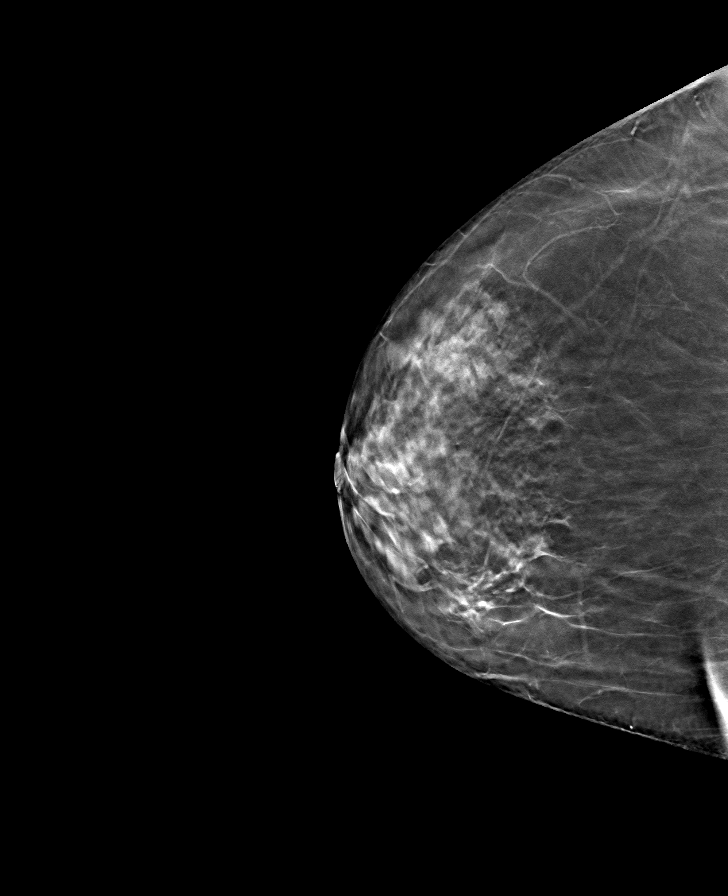

[L CC tomo · tomo slice 37/74.0]
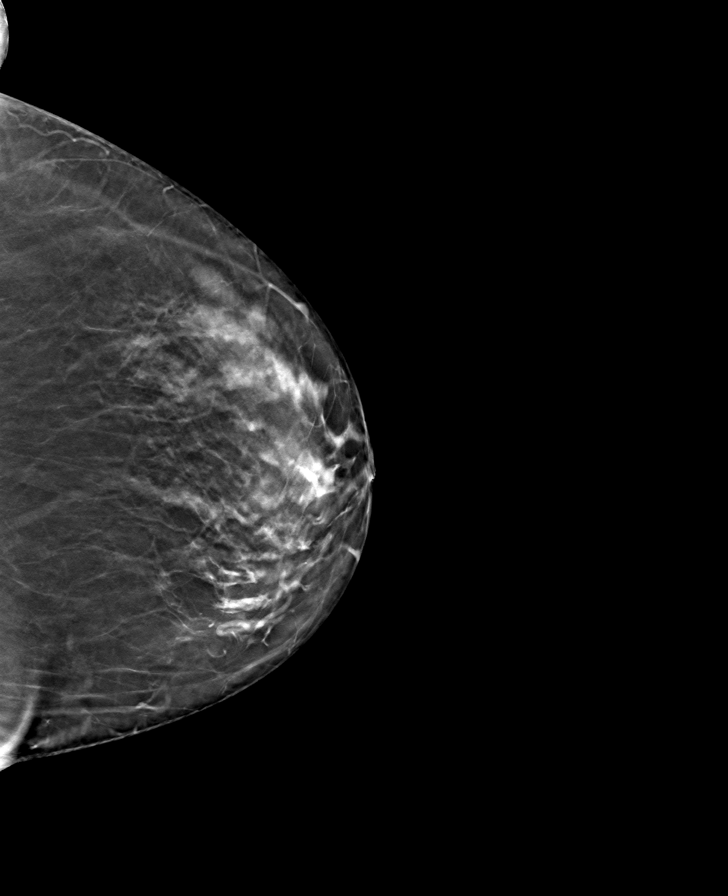

[8 of 24 positions shown; findings below may reference images not displayed]

ACR Breast Density Category b: There are scattered areas of
fibroglandular density.
FINDINGS: There are no findings suspicious for malignancy.
IMPRESSION: No mammographic evidence of malignancy. A result letter of this
screening mammogram will be mailed directly to the patient.

RECOMMENDATION:
Screening mammogram in one year. (Code:51-O-LD2)

BI-RADS CATEGORY  1: Negative.

## 2023-01-03 ENCOUNTER — Ambulatory Visit: Payer: Medicare Other | Admitting: Podiatry

## 2023-01-03 ENCOUNTER — Encounter: Payer: Self-pay | Admitting: Podiatry

## 2023-01-03 VITALS — Ht 63.0 in | Wt 160.0 lb

## 2023-01-03 DIAGNOSIS — L84 Corns and callosities: Secondary | ICD-10-CM

## 2023-01-03 NOTE — Progress Notes (Signed)
Subjective:   Patient ID: [name], female   DOB: 82 y.o.   MRN: [id number]   HPI Patient presents chronic keratotic lesion bottom of both feet painful   ROS      Objective:  Physical Exam  Neurovascular status intact keratotic lesion subsecond third metatarsals bilateral     Assessment:  Chronic lesion formation bilateral painful     Plan:  Sharp sterile debridement no intra genic bleeding reappoint routine care

## 2023-03-28 ENCOUNTER — Ambulatory Visit: Payer: Medicare Other | Admitting: Podiatry

## 2023-03-28 DIAGNOSIS — L84 Corns and callosities: Secondary | ICD-10-CM | POA: Diagnosis not present

## 2023-03-28 NOTE — Progress Notes (Signed)
Subjective:   Patient ID: [name], female   DOB: 83 y.o.   MRN: [id number]   HPI Patient presents with chronic painful lesions plantar aspect of both feet hard to walk on   ROS      Objective:  Physical Exam  Neurovascular status unchanged severe keratotic lesions subthird metatarsal both feet thick and painful     Assessment:  Chronic lesion bilateral painful     Plan:  Sterile sharp debridement of lesions did apply Band-Aids very slight bleeding instructed on soaks if needed and reappoint as symptoms indicate

## 2023-06-06 ENCOUNTER — Ambulatory Visit: Payer: Medicare Other | Admitting: Podiatry

## 2023-06-06 ENCOUNTER — Encounter: Payer: Self-pay | Admitting: Podiatry

## 2023-06-06 DIAGNOSIS — L84 Corns and callosities: Secondary | ICD-10-CM

## 2023-06-06 NOTE — Progress Notes (Signed)
 Subjective:   Patient ID: [name], female   DOB: 83 y.o.   MRN: [id number]   HPI Patient presents with exquisite discomfort plantar aspect of both forefoot stated it has been very sore   ROS      Objective:  Physical Exam  Neurovascular status intact chronic keratotic lesion bilateral forefoot controlled with treatment     Assessment:  Chronic callus formation plantar aspect both 2nd and 3rd metatarsal bones     Plan:  Sharp sterile debridement of lesions no iatrogenic bleeding reappoint routine care

## 2023-06-09 DIAGNOSIS — L821 Other seborrheic keratosis: Secondary | ICD-10-CM | POA: Diagnosis not present

## 2023-06-09 DIAGNOSIS — L570 Actinic keratosis: Secondary | ICD-10-CM | POA: Diagnosis not present

## 2023-06-09 DIAGNOSIS — L814 Other melanin hyperpigmentation: Secondary | ICD-10-CM | POA: Diagnosis not present

## 2023-07-12 DIAGNOSIS — E785 Hyperlipidemia, unspecified: Secondary | ICD-10-CM | POA: Diagnosis not present

## 2023-07-19 DIAGNOSIS — E785 Hyperlipidemia, unspecified: Secondary | ICD-10-CM | POA: Diagnosis not present

## 2023-07-19 DIAGNOSIS — Z0001 Encounter for general adult medical examination with abnormal findings: Secondary | ICD-10-CM | POA: Diagnosis not present

## 2023-07-19 DIAGNOSIS — Z1331 Encounter for screening for depression: Secondary | ICD-10-CM | POA: Diagnosis not present

## 2023-07-19 DIAGNOSIS — M810 Age-related osteoporosis without current pathological fracture: Secondary | ICD-10-CM | POA: Diagnosis not present

## 2023-07-19 DIAGNOSIS — Z0000 Encounter for general adult medical examination without abnormal findings: Secondary | ICD-10-CM | POA: Diagnosis not present

## 2023-07-19 DIAGNOSIS — E559 Vitamin D deficiency, unspecified: Secondary | ICD-10-CM | POA: Diagnosis not present

## 2023-07-25 DIAGNOSIS — L570 Actinic keratosis: Secondary | ICD-10-CM | POA: Diagnosis not present

## 2023-07-25 DIAGNOSIS — L814 Other melanin hyperpigmentation: Secondary | ICD-10-CM | POA: Diagnosis not present

## 2023-07-25 DIAGNOSIS — L821 Other seborrheic keratosis: Secondary | ICD-10-CM | POA: Diagnosis not present

## 2023-09-05 ENCOUNTER — Encounter: Payer: Self-pay | Admitting: Podiatry

## 2023-09-05 ENCOUNTER — Ambulatory Visit: Admitting: Podiatry

## 2023-09-05 DIAGNOSIS — L84 Corns and callosities: Secondary | ICD-10-CM | POA: Diagnosis not present

## 2023-09-06 DIAGNOSIS — H52223 Regular astigmatism, bilateral: Secondary | ICD-10-CM | POA: Diagnosis not present

## 2023-09-06 DIAGNOSIS — H353132 Nonexudative age-related macular degeneration, bilateral, intermediate dry stage: Secondary | ICD-10-CM | POA: Diagnosis not present

## 2023-09-06 DIAGNOSIS — Z9841 Cataract extraction status, right eye: Secondary | ICD-10-CM | POA: Diagnosis not present

## 2023-09-06 DIAGNOSIS — Z9842 Cataract extraction status, left eye: Secondary | ICD-10-CM | POA: Diagnosis not present

## 2023-09-06 NOTE — Progress Notes (Signed)
 Subjective:   Patient ID: [name], female   DOB: 83 y.o.   MRN: [id number]   HPI Patient presents with painful lesions subthird metatarsal bilateral   ROS      Objective:  Physical Exam  Lesion formation keratotic painful bilateral     Assessment:  Chronic lesion formation bilateral     Plan:  Debridement of lesions no iatrogenic bleeding reappoint routine care

## 2023-09-20 ENCOUNTER — Other Ambulatory Visit: Payer: Self-pay | Admitting: Internal Medicine

## 2023-09-20 DIAGNOSIS — Z1231 Encounter for screening mammogram for malignant neoplasm of breast: Secondary | ICD-10-CM

## 2023-10-27 ENCOUNTER — Ambulatory Visit
Admission: RE | Admit: 2023-10-27 | Discharge: 2023-10-27 | Disposition: A | Discharge status: Home / Self Care | Source: Ambulatory Visit | Attending: Internal Medicine | Admitting: Internal Medicine

## 2023-10-27 DIAGNOSIS — Z1231 Encounter for screening mammogram for malignant neoplasm of breast: Secondary | ICD-10-CM | POA: Insufficient documentation

## 2023-12-07 ENCOUNTER — Ambulatory Visit: Admitting: Podiatry

## 2023-12-07 ENCOUNTER — Encounter: Payer: Self-pay | Admitting: Podiatry

## 2023-12-07 DIAGNOSIS — L84 Corns and callosities: Secondary | ICD-10-CM | POA: Diagnosis not present

## 2023-12-07 NOTE — Progress Notes (Signed)
 Subjective:   Patient ID: [name], female   DOB: 83 y.o.   MRN: [id number]   HPI Patient presents with chronic lesion plantar aspect both feet painful   ROS      Objective:  Physical Exam  Neurovascular status intact keratotic lesions of both feet painful     Assessment:  Chronic lesion due to bone structure     Plan:  Debridement painful corn callus bilateral reappoint as needed

## 2024-03-08 ENCOUNTER — Encounter: Payer: Self-pay | Admitting: Podiatry

## 2024-03-08 ENCOUNTER — Ambulatory Visit: Admitting: Podiatry

## 2024-03-08 DIAGNOSIS — L84 Corns and callosities: Secondary | ICD-10-CM

## 2024-03-08 NOTE — Progress Notes (Signed)
 Subjective:   Patient ID: [name], female   DOB: 84 y.o.   MRN: [id number]   HPI Patient presents with severe lesions subthird metatarsal bilateral   ROS      Objective:  Physical Exam  Neurovascular status intact keratotic lesion plantar aspect both feet painful     Assessment:  Lesion formation with pain bilateral     Plan:  Debridement of lesions no iatrogenic bleeding reappoint routine care

## 2024-06-06 ENCOUNTER — Ambulatory Visit: Admitting: Podiatry
